# Patient Record
Sex: Female | Born: 1985
Health system: Southern US, Community
[De-identification: ages and names within clinical notes are randomized; demographics above are authoritative.]

## PROBLEM LIST (undated history)

## (undated) DIAGNOSIS — R87629 Unspecified abnormal cytological findings in specimens from vagina: Secondary | ICD-10-CM

## (undated) DIAGNOSIS — A64 Unspecified sexually transmitted disease: Secondary | ICD-10-CM

## (undated) DIAGNOSIS — B1920 Unspecified viral hepatitis C without hepatic coma: Secondary | ICD-10-CM

## (undated) DIAGNOSIS — B999 Unspecified infectious disease: Secondary | ICD-10-CM

## (undated) HISTORY — PX: OTHER SURGICAL HISTORY: SHX169

---
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
## 2009-02-11 ENCOUNTER — Emergency Department (HOSPITAL_BASED_OUTPATIENT_CLINIC_OR_DEPARTMENT_OTHER): Admission: EM | Admit: 2009-02-11 | Discharge: 2009-02-11 | Payer: Self-pay | Admitting: Emergency Medicine

## 2009-02-16 ENCOUNTER — Encounter: Admission: RE | Admit: 2009-02-16 | Discharge: 2009-02-16 | Payer: Self-pay | Admitting: Obstetrics and Gynecology

## 2009-04-20 ENCOUNTER — Ambulatory Visit: Payer: Self-pay | Admitting: Gastroenterology

## 2009-05-04 ENCOUNTER — Inpatient Hospital Stay (HOSPITAL_COMMUNITY): Admission: AD | Admit: 2009-05-04 | Discharge: 2009-05-07 | Payer: Self-pay | Admitting: Obstetrics and Gynecology

## 2009-05-08 ENCOUNTER — Encounter: Admission: RE | Admit: 2009-05-08 | Discharge: 2009-06-06 | Payer: Self-pay | Admitting: Obstetrics & Gynecology

## 2009-05-13 ENCOUNTER — Inpatient Hospital Stay (HOSPITAL_COMMUNITY): Admission: AD | Admit: 2009-05-13 | Discharge: 2009-05-13 | Payer: Self-pay | Admitting: Obstetrics and Gynecology

## 2009-05-17 ENCOUNTER — Inpatient Hospital Stay (HOSPITAL_COMMUNITY): Admission: AD | Admit: 2009-05-17 | Discharge: 2009-05-17 | Payer: Self-pay | Admitting: Obstetrics & Gynecology

## 2009-06-07 ENCOUNTER — Encounter: Admission: RE | Admit: 2009-06-07 | Discharge: 2009-07-07 | Payer: Self-pay | Admitting: Obstetrics & Gynecology

## 2009-07-01 ENCOUNTER — Emergency Department (HOSPITAL_COMMUNITY): Admission: EM | Admit: 2009-07-01 | Discharge: 2009-07-01 | Payer: Self-pay | Admitting: Emergency Medicine

## 2009-07-03 ENCOUNTER — Emergency Department (HOSPITAL_BASED_OUTPATIENT_CLINIC_OR_DEPARTMENT_OTHER): Admission: EM | Admit: 2009-07-03 | Discharge: 2009-07-03 | Payer: Self-pay | Admitting: Emergency Medicine

## 2009-07-08 ENCOUNTER — Encounter: Admission: RE | Admit: 2009-07-08 | Discharge: 2009-08-06 | Payer: Self-pay | Admitting: Obstetrics & Gynecology

## 2009-08-07 ENCOUNTER — Encounter: Admission: RE | Admit: 2009-08-07 | Discharge: 2009-08-24 | Payer: Self-pay | Admitting: Obstetrics & Gynecology

## 2009-09-07 ENCOUNTER — Encounter: Admission: RE | Admit: 2009-09-07 | Discharge: 2009-10-07 | Payer: Self-pay | Admitting: Obstetrics & Gynecology

## 2009-10-08 ENCOUNTER — Encounter: Admission: RE | Admit: 2009-10-08 | Discharge: 2009-11-07 | Payer: Self-pay | Admitting: Obstetrics & Gynecology

## 2009-12-06 ENCOUNTER — Encounter: Admission: RE | Admit: 2009-12-06 | Discharge: 2010-01-05 | Payer: Self-pay | Admitting: Obstetrics & Gynecology

## 2009-12-09 ENCOUNTER — Emergency Department (HOSPITAL_BASED_OUTPATIENT_CLINIC_OR_DEPARTMENT_OTHER): Admission: EM | Admit: 2009-12-09 | Discharge: 2009-12-09 | Payer: Self-pay | Admitting: Emergency Medicine

## 2010-01-06 ENCOUNTER — Encounter: Admission: RE | Admit: 2010-01-06 | Discharge: 2010-02-05 | Payer: Self-pay | Admitting: Obstetrics & Gynecology

## 2010-02-06 ENCOUNTER — Encounter: Admission: RE | Admit: 2010-02-06 | Discharge: 2010-02-22 | Payer: Self-pay | Admitting: Obstetrics & Gynecology

## 2010-03-09 ENCOUNTER — Encounter: Admission: RE | Admit: 2010-03-09 | Discharge: 2010-04-03 | Payer: Self-pay | Admitting: Obstetrics & Gynecology

## 2010-09-17 ENCOUNTER — Encounter: Payer: Self-pay | Admitting: Obstetrics and Gynecology

## 2010-11-30 LAB — CULTURE, ROUTINE-ABSCESS
Culture: NO GROWTH
Gram Stain: NONE SEEN

## 2010-11-30 LAB — DIFFERENTIAL
Basophils Absolute: 0 10*3/uL (ref 0.0–0.1)
Basophils Absolute: 0.1 10*3/uL (ref 0.0–0.1)
Basophils Relative: 0 % (ref 0–1)
Basophils Relative: 0 % (ref 0–1)
Eosinophils Absolute: 0.2 10*3/uL (ref 0.0–0.7)
Eosinophils Absolute: 0.1 10*3/uL (ref 0.0–0.7)
Eosinophils Relative: 2 % (ref 0–5)
Eosinophils Relative: 1 % (ref 0–5)
Lymphocytes Relative: 36 % (ref 12–46)
Lymphocytes Relative: 19 % (ref 12–46)
Lymphs Abs: 3.1 10*3/uL (ref 0.7–4.0)
Lymphs Abs: 3.8 10*3/uL (ref 0.7–4.0)
Monocytes Absolute: 0.5 10*3/uL (ref 0.1–1.0)
Monocytes Absolute: 1.1 10*3/uL — ABNORMAL HIGH (ref 0.1–1.0)
Monocytes Relative: 6 % (ref 3–12)
Monocytes Relative: 5 % (ref 3–12)
Neutro Abs: 4.9 10*3/uL (ref 1.7–7.7)
Neutro Abs: 14.8 10*3/uL — ABNORMAL HIGH (ref 1.7–7.7)
Neutrophils Relative %: 56 % (ref 43–77)
Neutrophils Relative %: 74 % (ref 43–77)

## 2010-11-30 LAB — CBC
HCT: 38.9 % (ref 36.0–46.0)
HCT: 34.6 % — ABNORMAL LOW (ref 36.0–46.0)
HCT: 37.2 % (ref 36.0–46.0)
Hemoglobin: 13.1 g/dL (ref 12.0–15.0)
Hemoglobin: 11.9 g/dL — ABNORMAL LOW (ref 12.0–15.0)
Hemoglobin: 12.5 g/dL (ref 12.0–15.0)
MCHC: 33.6 g/dL (ref 30.0–36.0)
MCHC: 33.6 g/dL (ref 30.0–36.0)
MCHC: 34.4 g/dL (ref 30.0–36.0)
MCV: 97.3 fL (ref 78.0–100.0)
MCV: 96.6 fL (ref 78.0–100.0)
MCV: 97 fL (ref 78.0–100.0)
Platelets: 290 10*3/uL (ref 150–400)
Platelets: 158 10*3/uL (ref 150–400)
Platelets: 167 10*3/uL (ref 150–400)
RBC: 4 MIL/uL (ref 3.87–5.11)
RBC: 3.57 MIL/uL — ABNORMAL LOW (ref 3.87–5.11)
RBC: 3.86 MIL/uL — ABNORMAL LOW (ref 3.87–5.11)
RDW: 13.7 % (ref 11.5–15.5)
RDW: 13.2 % (ref 11.5–15.5)
RDW: 13.8 % (ref 11.5–15.5)
WBC: 8.8 10*3/uL (ref 4.0–10.5)
WBC: 19.2 10*3/uL — ABNORMAL HIGH (ref 4.0–10.5)
WBC: 19.9 10*3/uL — ABNORMAL HIGH (ref 4.0–10.5)

## 2010-11-30 LAB — RH IMMUNE GLOB WKUP(>/=20WKS)(NOT WOMEN'S HOSP): Fetal Screen: NEGATIVE

## 2010-11-30 LAB — RPR: RPR Ser Ql: NONREACTIVE

## 2011-07-02 ENCOUNTER — Encounter (HOSPITAL_COMMUNITY): Payer: Self-pay

## 2011-07-02 ENCOUNTER — Inpatient Hospital Stay (HOSPITAL_COMMUNITY): Payer: PRIVATE HEALTH INSURANCE

## 2011-07-02 ENCOUNTER — Inpatient Hospital Stay (HOSPITAL_COMMUNITY)
Admission: AD | Admit: 2011-07-02 | Discharge: 2011-07-02 | Disposition: A | Discharge status: Home / Self Care | Payer: PRIVATE HEALTH INSURANCE | Source: Ambulatory Visit | Attending: Obstetrics & Gynecology | Admitting: Obstetrics & Gynecology

## 2011-07-02 DIAGNOSIS — O0930 Supervision of pregnancy with insufficient antenatal care, unspecified trimester: Secondary | ICD-10-CM | POA: Insufficient documentation

## 2011-07-02 DIAGNOSIS — Z3480 Encounter for supervision of other normal pregnancy, unspecified trimester: Secondary | ICD-10-CM

## 2011-07-02 DIAGNOSIS — O99891 Other specified diseases and conditions complicating pregnancy: Secondary | ICD-10-CM | POA: Insufficient documentation

## 2011-07-02 DIAGNOSIS — R109 Unspecified abdominal pain: Secondary | ICD-10-CM | POA: Insufficient documentation

## 2011-07-02 HISTORY — DX: Unspecified sexually transmitted disease: A64

## 2011-07-02 HISTORY — DX: Unspecified viral hepatitis C without hepatic coma: B19.20

## 2011-07-02 HISTORY — DX: Unspecified infectious disease: B99.9

## 2011-07-02 MED ORDER — PRENATAL RX 60-1 MG PO TABS: 1.0000 | ORAL_TABLET | Freq: Every day | ORAL | Status: DC
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
## 2011-07-02 NOTE — ED Notes (Signed)
Kara [name] CNM in with pt

## 2011-07-02 NOTE — Progress Notes (Signed)
Patient states she had a positive home pregnancy test at the end of August. Wants to know how far she is. States she is not having any bleeding, pain or discharge at this time.

## 2011-08-27 NOTE — L&D Delivery Note (Signed)
Delivery Note At 7:59 PM a viable female was delivered via Vaginal, Spontaneous Delivery (Presentation: Middle Occiput Anterior).  APGAR: 7, 9; weight 7 lb 6.7 oz (3365 g).   Placenta status: Intact, Spontaneous.  Cord: 3 vessels with the following complications: None.  Cord pH: NA  Anesthesia: Epidural  Episiotomy: None Lacerations: None Suture Repair: NA Est. Blood Loss (mL): 300  Mom to postpartum.  Baby to nursery-stable. Placenta to: BS Feeding: Breast Circ: NA Contraception: Sherlynn Stalls, Emilo Gras 12/26/2011, 9:03 PM

## 2011-09-12 ENCOUNTER — Encounter: Payer: Self-pay | Admitting: *Deleted

## 2011-09-12 ENCOUNTER — Ambulatory Visit: Payer: PRIVATE HEALTH INSURANCE | Admitting: *Deleted

## 2011-09-12 DIAGNOSIS — Z3480 Encounter for supervision of other normal pregnancy, unspecified trimester: Secondary | ICD-10-CM

## 2011-09-12 DIAGNOSIS — O0930 Supervision of pregnancy with insufficient antenatal care, unspecified trimester: Secondary | ICD-10-CM

## 2011-09-12 LAB — OB RESULTS CONSOLE RPR: RPR: NONREACTIVE

## 2011-09-12 NOTE — Progress Notes (Signed)
Pt here for late OB care @ 28 wks.  Prenatal labs and 28 we GTT done.  Pt is positive for Hep C.  Pos abnormal pap smear in the past had had cryo on cervix.  She is scheduled for anatomy U/S 09/17/11 @ [facility].  She is scheduled for her prenatal exam on the 25th with our midwife.

## 2011-09-13 LAB — HIV ANTIBODY (ROUTINE TESTING W REFLEX): HIV: NONREACTIVE

## 2011-09-13 LAB — OBSTETRIC PANEL
Antibody Screen: NEGATIVE
Basophils Absolute: 0 10*3/uL (ref 0.0–0.1)
Basophils Relative: 0 % (ref 0–1)
Eosinophils Absolute: 0.1 10*3/uL (ref 0.0–0.7)
HCT: 34.1 % — ABNORMAL LOW (ref 36.0–46.0)
Hemoglobin: 11.7 g/dL — ABNORMAL LOW (ref 12.0–15.0)
Hepatitis B Surface Ag: NEGATIVE
Lymphocytes Relative: 26 % (ref 12–46)
Lymphs Abs: 2.6 10*3/uL (ref 0.7–4.0)
MCH: 31.3 pg (ref 26.0–34.0)
MCV: 91.2 fL (ref 78.0–100.0)
Monocytes Absolute: 0.4 10*3/uL (ref 0.1–1.0)
Neutro Abs: 6.6 10*3/uL (ref 1.7–7.7)
Neutrophils Relative %: 68 % (ref 43–77)
Platelets: 190 10*3/uL (ref 150–400)
RBC: 3.74 MIL/uL — ABNORMAL LOW (ref 3.87–5.11)
RDW: 12.7 % (ref 11.5–15.5)
Rh Type: NEGATIVE
Rubella: 12.9 IU/mL — ABNORMAL HIGH
WBC: 9.7 10*3/uL (ref 4.0–10.5)

## 2011-09-14 LAB — CULTURE, URINE COMPREHENSIVE: Organism ID, Bacteria: NO GROWTH

## 2011-09-14 LAB — GLUCOSE TOLERANCE, 1 HOUR: Glucose, 1 Hour GTT: 95 mg/dL (ref 70–140)

## 2011-09-17 ENCOUNTER — Encounter: Payer: Self-pay | Admitting: Obstetrics & Gynecology

## 2011-09-17 ENCOUNTER — Ambulatory Visit (HOSPITAL_COMMUNITY)
Admission: RE | Admit: 2011-09-17 | Discharge: 2011-09-17 | Disposition: A | Discharge status: Home / Self Care | Payer: PRIVATE HEALTH INSURANCE | Source: Ambulatory Visit | Attending: Obstetrics & Gynecology | Admitting: Obstetrics & Gynecology

## 2011-09-17 DIAGNOSIS — Z363 Encounter for antenatal screening for malformations: Secondary | ICD-10-CM | POA: Insufficient documentation

## 2011-09-17 DIAGNOSIS — Z1389 Encounter for screening for other disorder: Secondary | ICD-10-CM | POA: Insufficient documentation

## 2011-09-17 DIAGNOSIS — O359XX0 Maternal care for (suspected) fetal abnormality and damage, unspecified, not applicable or unspecified: Secondary | ICD-10-CM | POA: Insufficient documentation

## 2011-09-17 DIAGNOSIS — O0930 Supervision of pregnancy with insufficient antenatal care, unspecified trimester: Secondary | ICD-10-CM

## 2011-09-17 DIAGNOSIS — O358XX0 Maternal care for other (suspected) fetal abnormality and damage, not applicable or unspecified: Secondary | ICD-10-CM | POA: Insufficient documentation

## 2011-09-20 ENCOUNTER — Encounter: Payer: PRIVATE HEALTH INSURANCE | Admitting: Family

## 2011-09-23 ENCOUNTER — Encounter: Payer: PRIVATE HEALTH INSURANCE | Admitting: Obstetrics and Gynecology

## 2011-09-27 ENCOUNTER — Ambulatory Visit (INDEPENDENT_AMBULATORY_CARE_PROVIDER_SITE_OTHER): Payer: PRIVATE HEALTH INSURANCE | Admitting: Advanced Practice Midwife

## 2011-09-27 ENCOUNTER — Encounter: Payer: Self-pay | Admitting: Advanced Practice Midwife

## 2011-09-27 ENCOUNTER — Other Ambulatory Visit: Payer: Self-pay | Admitting: *Deleted

## 2011-09-27 DIAGNOSIS — F1120 Opioid dependence, uncomplicated: Secondary | ICD-10-CM

## 2011-09-27 DIAGNOSIS — Z6791 Unspecified blood type, Rh negative: Secondary | ICD-10-CM

## 2011-09-27 DIAGNOSIS — O359XX0 Maternal care for (suspected) fetal abnormality and damage, unspecified, not applicable or unspecified: Secondary | ICD-10-CM

## 2011-09-27 DIAGNOSIS — O09219 Supervision of pregnancy with history of pre-term labor, unspecified trimester: Secondary | ICD-10-CM

## 2011-09-27 DIAGNOSIS — Z3480 Encounter for supervision of other normal pregnancy, unspecified trimester: Secondary | ICD-10-CM | POA: Insufficient documentation

## 2011-09-27 DIAGNOSIS — Z113 Encounter for screening for infections with a predominantly sexual mode of transmission: Secondary | ICD-10-CM

## 2011-09-27 DIAGNOSIS — O360990 Maternal care for other rhesus isoimmunization, unspecified trimester, not applicable or unspecified: Secondary | ICD-10-CM

## 2011-09-27 DIAGNOSIS — O99340 Other mental disorders complicating pregnancy, unspecified trimester: Secondary | ICD-10-CM | POA: Insufficient documentation

## 2011-09-27 DIAGNOSIS — F411 Generalized anxiety disorder: Secondary | ICD-10-CM

## 2011-09-27 DIAGNOSIS — O09899 Supervision of other high risk pregnancies, unspecified trimester: Secondary | ICD-10-CM | POA: Insufficient documentation

## 2011-09-27 DIAGNOSIS — Z8619 Personal history of other infectious and parasitic diseases: Secondary | ICD-10-CM

## 2011-09-27 DIAGNOSIS — F419 Anxiety disorder, unspecified: Secondary | ICD-10-CM | POA: Insufficient documentation

## 2011-09-27 DIAGNOSIS — O99320 Drug use complicating pregnancy, unspecified trimester: Secondary | ICD-10-CM

## 2011-09-27 DIAGNOSIS — O358XX0 Maternal care for other (suspected) fetal abnormality and damage, not applicable or unspecified: Secondary | ICD-10-CM

## 2011-09-27 DIAGNOSIS — O0930 Supervision of pregnancy with insufficient antenatal care, unspecified trimester: Secondary | ICD-10-CM | POA: Insufficient documentation

## 2011-09-27 DIAGNOSIS — Z1272 Encounter for screening for malignant neoplasm of vagina: Secondary | ICD-10-CM

## 2011-09-27 MED ORDER — CONCEPT OB 130-92.4-1 MG PO CAPS: 1.0000 | ORAL_CAPSULE | Freq: Every day | ORAL | Status: AC

## 2011-09-27 MED ORDER — SERTRALINE HCL 50 MG PO TABS: 50.0000 mg | ORAL_TABLET | Freq: Every day | ORAL | Status: DC

## 2011-09-27 NOTE — Patient Instructions (Addendum)
CHANGES OCCURING IN THE SECOND TRIMESTER OF PREGNANCY Your body goes through many changes during pregnancy. They vary from person to person. Talk to your caregiver about changes you notice that you are concerned about.  During the second trimester, you will likely have an increase in your appetite. It is normal to have cravings for certain foods. This varies from person to person and pregnancy to pregnancy.   Your lower abdomen will begin to bulge.   You may have to urinate more often because the uterus and baby are pressing on your bladder. It is also common to get more bladder infections during pregnancy (pain with urination). You can help this by drinking lots of fluids and emptying your bladder before and after intercourse.   You may begin to get stretch marks on your hips, abdomen, and breasts. These are normal changes in the body during pregnancy. There are no exercises or medications to take that prevent this change.   You may begin to develop swollen and bulging veins (varicose veins) in your legs. Wearing support hose, elevating your feet for 15 minutes, 3 to 4 times a day and limiting salt in your diet helps lessen the problem.   Heartburn may develop as the uterus grows and pushes up against the stomach. Antacids recommended by your caregiver helps with this problem. Also, eating smaller meals 4 to 5 times a day helps.   Constipation can be treated with a stool softener or adding bulk to your diet. Drinking lots of fluids, vegetables, fruits, and whole grains are helpful.   Exercising is also helpful. If you have been very active up until your pregnancy, most of these activities can be continued during your pregnancy. If you have been less active, it is helpful to start an exercise program such as walking.   Hemorrhoids (varicose veins in the rectum) may develop at the end of the second trimester. Warm sitz baths and hemorrhoid cream recommended by your caregiver helps hemorrhoid  problems.   Backaches may develop during this time of your pregnancy. Avoid heavy lifting, wear low heal shoes and practice good posture to help with backache problems.   Some pregnant women develop tingling and numbness of their hand and fingers because of swelling and tightening of ligaments in the wrist (carpel tunnel syndrome). This goes away after the baby is born.   As your breasts enlarge, you may have to get a bigger bra. Get a comfortable, cotton, support bra. Do not get a nursing bra until the last month of the pregnancy if you will be nursing the baby.   You may get a dark line from your belly button to the pubic area called the linea nigra.   You may develop rosy cheeks because of increase blood flow to the face.   You may develop spider looking lines of the face, neck, arms and chest. These go away after the baby is born.  HOME CARE INSTRUCTIONS   It is extremely important to avoid all smoking, herbs, alcohol, and unprescribed drugs during your pregnancy. These chemicals affect the formation and growth of the baby. Avoid these chemicals throughout the pregnancy to ensure the delivery of a healthy infant.   Most of your home care instructions are the same as suggested for the first trimester of your pregnancy. Keep your caregiver's appointments. Follow your caregiver's instructions regarding medication use, exercise and diet.   During pregnancy, you are providing food for you and your baby. Continue to eat regular, well-balanced meals. Choose  foods such as meat, fish, milk and other low fat dairy products, vegetables, fruits, and whole-grain breads and cereals. Your caregiver will tell you of the ideal weight gain.   A physical sexual relationship may be continued up until near the end of pregnancy if there are no other problems. Problems could include early (premature) leaking of amniotic fluid from the membranes, vaginal bleeding, abdominal pain, or other medical or pregnancy  problems.   Exercise regularly if there are no restrictions. Check with your caregiver if you are unsure of the safety of some of your exercises. The greatest weight gain will occur in the last 2 trimesters of pregnancy. Exercise will help you:   Control your weight.   Get you in shape for labor and delivery.   Lose weight after you have the baby.   Wear a good support or jogging bra for breast tenderness during pregnancy. This may help if worn during sleep. Pads or tissues may be used in the bra if you are leaking colostrum.   Do not use hot tubs, steam rooms or saunas throughout the pregnancy.   Wear your seat belt at all times when driving. This protects you and your baby if you are in an accident.   Avoid raw meat, uncooked cheese, cat litter boxes and soil used by cats. These carry germs that can cause birth defects in the baby.   The second trimester is also a good time to visit your dentist for your dental health if this has not been done yet. Getting your teeth cleaned is OK. Use a soft toothbrush. Brush gently during pregnancy.   It is easier to loose urine during pregnancy. Tightening up and strengthening the pelvic muscles will help with this problem. Practice stopping your urination while you are going to the bathroom. These are the same muscles you need to strengthen. It is also the muscles you would use as if you were trying to stop from passing gas. You can practice tightening these muscles up 10 times a set and repeating this about 3 times per day. Once you know what muscles to tighten up, do not perform these exercises during urination. It is more likely to contribute to an infection by backing up the urine.   Ask for help if you have financial, counseling or nutritional needs during pregnancy. Your caregiver will be able to offer counseling for these needs as well as refer you for other special needs.   Your skin may become oily. If so, wash your face with mild soap, use  non-greasy moisturizer and oil or cream based makeup.  MEDICATIONS AND DRUG USE IN PREGNANCY  Take prenatal vitamins as directed. The vitamin should contain 1 milligram of folic acid. Keep all vitamins out of reach of children. Only a couple vitamins or tablets containing iron may be fatal to a baby or young child when ingested.   Avoid use of all medications, including herbs, over-the-counter medications, not prescribed or suggested by your caregiver. Only take over-the-counter or prescription medicines for pain, discomfort, or fever as directed by your caregiver. Do not use aspirin.   Let your caregiver also know about herbs you may be using.   Alcohol is related to a number of birth defects. This includes fetal alcohol syndrome. All alcohol, in any form, should be avoided completely. Smoking will cause low birth rate and premature babies.   Street or illegal drugs are very harmful to the baby. They are absolutely forbidden. A baby born to an  addicted mother will be addicted at birth. The baby will go through the same withdrawal an adult does.  SEEK MEDICAL CARE IF:  You have any concerns or worries during your pregnancy. It is better to call with your questions if you feel they cannot wait, rather than worry about them. SEEK IMMEDIATE MEDICAL CARE IF:   An unexplained oral temperature above 102 F (38.9 C) develops, or as your caregiver suggests.   You have leaking of fluid from the vagina (birth canal). If leaking membranes are suspected, take your temperature and tell your caregiver of this when you call.   There is vaginal spotting, bleeding, or passing clots. Tell your caregiver of the amount and how many pads are used. Light spotting in pregnancy is common, especially following intercourse.   You develop a bad smelling vaginal discharge with a change in the color from clear to white.   You continue to feel sick to your stomach (nauseated) and have no relief from remedies suggested.  You vomit blood or coffee ground-like materials.   You lose more than 2 pounds of weight or gain more than 2 pounds of weight over 1 week, or as suggested by your caregiver.   You notice swelling of your face, hands, feet, or legs.   You get exposed to [place] measles and have never had them.   You are exposed to fifth disease or chickenpox.   You develop belly (abdominal) pain. Round ligament discomfort is a common non-cancerous (benign) cause of abdominal pain in pregnancy. Your caregiver still must evaluate you.   You develop a bad headache that does not go away.   You develop fever, diarrhea, pain with urination, or shortness of breath.   You develop visual problems, blurry, or double vision.   You fall or are in a car accident or any kind of trauma.   There is mental or physical violence at home.  Document Released: 08/06/2001 Document Revised: 04/24/2011 Document Reviewed: 02/08/2009 [facility] Patient Information 2012 [place], [place].  Anxiety and Panic Attacks Your caregiver has informed you that you are having an anxiety or panic attack. There may be many forms of this. Most of the time these attacks come suddenly and without warning. They come at any time of day, including periods of sleep, and at any time of life. They may be strong and unexplained. Although panic attacks are very scary, they are physically harmless. Sometimes the cause of your anxiety is not known. Anxiety is a protective mechanism of the body in its fight or flight mechanism. Most of these perceived danger situations are actually nonphysical situations (such as anxiety over losing a job). CAUSES  The causes of an anxiety or panic attack are many. Panic attacks may occur in otherwise healthy people given a certain set of circumstances. There may be a genetic cause for panic attacks. Some medications may also have anxiety as a side effect. SYMPTOMS  Some of the most common feelings are:  Intense terror.    Dizziness, feeling faint.   Hot and cold flashes.   Fear of going crazy.   Feelings that nothing is real.   Sweating.   Shaking.   Chest pain or a fast heartbeat (palpitations).   Smothering, choking sensations.   Feelings of impending doom and that death is near.   Tingling of extremities, this may be from over-breathing.   Altered reality (derealization).   Being detached from yourself (depersonalization).  Several symptoms can be present to make up anxiety or  panic attacks. DIAGNOSIS  The evaluation by your caregiver will depend on the type of symptoms you are experiencing. The diagnosis of anxiety or panic attack is made when no physical illness can be determined to be a cause of the symptoms. TREATMENT  Treatment to prevent anxiety and panic attacks may include:  Avoidance of circumstances that cause anxiety.   Reassurance and relaxation.   Regular exercise.   Relaxation therapies, such as yoga.   Psychotherapy with a psychiatrist or therapist.   Avoidance of caffeine, alcohol and illegal drugs.   Prescribed medication.  SEEK IMMEDIATE MEDICAL CARE IF:   You experience panic attack symptoms that are different than your usual symptoms.   You have any worsening or concerning symptoms.  Document Released: 08/12/2005 Document Revised: 04/24/2011 Document Reviewed: 12/14/2009 [facility] Patient Information 2012 [place], [place].

## 2011-09-27 NOTE — Progress Notes (Signed)
Subjective:    Kara [name] is a [id number] [redacted]w[redacted]d being seen today for her first obstetrical visit.  Her obstetrical history is significant for late/limited prenatal care, Saboxone for Hx opiod dependence, Hx Hep C, anxiety disorder, spontaneous PTD at 36 weeks (too late for 17-P) and LVEIF on anatomy scan. Patient does intend to breast feed. Pregnancy history fully reviewed.  Patient reports no bleeding, no contractions, no cramping and no leaking.  Filed Vitals:   09/27/11 1028  BP: 118/65  Temp: 98.4 F (36.9 C)  Weight: 199 lb (90.266 kg)    HISTORY: OB History    Grav Para Term Preterm Abortions TAB SAB Ect Mult Living   4 1  1 2 2    1      # Outc Date GA Lbr Len/2nd Wgt Sex Del Anes PTL Lv   1 PRE 9/10 [redacted]w[redacted]d   F SVD EPI No Yes   2 TAB            3 TAB            4 CUR              Past Medical History  Diagnosis Date  . Hepatitis C     Dx 2009  . Infection   . STD (female)   . Preterm labor    Past Surgical History  Procedure Date  . Extraction of wisdome teeth     age 69   Family History  Problem Relation Age of Onset  . Adopted: Yes     Exam    Uterine Size: 28 cm  Pelvic Exam:    Perineum: Normal Perineum   Vulva: normal   Vagina:  normal mucosa, normal discharge   pH: NA   Cervix: multiparous appearance and no lesions   Adnexa: no mass, fullness, tenderness   Bony Pelvis: proven   System: Breast:  normal appearance, no masses or tenderness   Skin: normal coloration and turgor, no rashes    Neurologic: oriented, normal, normal mood, gait normal; reflexes normal and symmetric   Extremities: normal strength, tone, and muscle mass   HEENT PERRLA and sclera clear, anicteric   Mouth/Teeth mucous membranes moist, pharynx normal without lesions and dental hygiene good   Neck supple   Cardiovascular: regular rate and rhythm   Respiratory:  appears well, vitals normal, no respiratory distress, acyanotic, normal RR, ear and throat exam is normal,  neck free of mass or lymphadenopathy, chest clear, no wheezing, crepitations, rhonchi, normal symmetric air entry   Abdomen: soft, non-tender; bowel sounds normal; no masses,  no organomegaly   Urinary: urethral meatus normal     Assessment:    Pregnancy: [id number] Patient Active Problem List  Diagnoses  . 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 Suboxone maintenance treatment complicating pregnancy, antepartum  . Echogenic focus of heart of fetus affecting antepartum care of mother  . Rh negative state in antepartum period  . Normal pregnancy, repeat  . Anxiety in pregnancy, antepartum  . Insufficient prenatal care  . History of preterm delivery, currently pregnant       Hx Hep C      Plan:     Initial labs reviewed. Antibody screen repeated. Has Prenatal vitamins. Problem list reviewed and updated. Genetic Screening discussed: Too late for genetic screening.  Ultrasound discussed; fetal survey: results reviewed.  Follow up in 2 weeks. 75% of 30 min visit spent on counseling and coordination of care.  Rophylac given  Will  consult w/ MD regarding Hep C testing ROI for Hep C Dx, Tx  Kara [name] 09/27/2011

## 2011-09-27 NOTE — Progress Notes (Signed)
p-84  Pt needs CF screen @ next visit.

## 2011-09-28 LAB — ANTIBODY SCREEN: Antibody Screen: NEGATIVE

## 2011-09-30 ENCOUNTER — Telehealth: Payer: Self-pay | Admitting: *Deleted

## 2011-09-30 MED ORDER — RHO D IMMUNE GLOBULIN 1500 UNIT/2ML IJ SOLN
300.0000 ug | Freq: Once | INTRAMUSCULAR | Status: AC
  Administered 2011-09-30: 300 ug via INTRAMUSCULAR

## 2011-09-30 NOTE — Telephone Encounter (Signed)
CF results were not in system.  Called the lab and the CF was not ran because they did not receive a whole purple top tube.  Will draw at next appt.

## 2011-10-01 ENCOUNTER — Encounter: Payer: Self-pay | Admitting: Advanced Practice Midwife

## 2011-10-01 DIAGNOSIS — B1920 Unspecified viral hepatitis C without hepatic coma: Secondary | ICD-10-CM | POA: Insufficient documentation

## 2011-10-11 ENCOUNTER — Encounter: Payer: PRIVATE HEALTH INSURANCE | Admitting: Advanced Practice Midwife

## 2011-10-18 ENCOUNTER — Ambulatory Visit (INDEPENDENT_AMBULATORY_CARE_PROVIDER_SITE_OTHER): Payer: PRIVATE HEALTH INSURANCE | Admitting: Family

## 2011-10-18 DIAGNOSIS — Z3480 Encounter for supervision of other normal pregnancy, unspecified trimester: Secondary | ICD-10-CM

## 2011-10-18 DIAGNOSIS — Z8619 Personal history of other infectious and parasitic diseases: Secondary | ICD-10-CM

## 2011-10-18 NOTE — Progress Notes (Signed)
p-80 

## 2011-10-18 NOTE — Progress Notes (Signed)
No questions or concerns; doing well; obtained Hep c RNA and liver enzymes per Dr. Macon Large recommendation

## 2011-10-19 LAB — COMPREHENSIVE METABOLIC PANEL
Albumin: 3.3 g/dL — ABNORMAL LOW (ref 3.5–5.2)
BUN: 7 mg/dL (ref 6–23)
CO2: 20 mEq/L (ref 19–32)
Calcium: 7.8 mg/dL — ABNORMAL LOW (ref 8.4–10.5)
Chloride: 109 mEq/L (ref 96–112)
Glucose, Bld: 87 mg/dL (ref 70–99)
Potassium: 4.1 mEq/L (ref 3.5–5.3)
Total Protein: 5.9 g/dL — ABNORMAL LOW (ref 6.0–8.3)

## 2011-10-27 ENCOUNTER — Other Ambulatory Visit: Payer: Self-pay | Admitting: Family

## 2011-10-27 ENCOUNTER — Encounter: Payer: Self-pay | Admitting: Family

## 2011-10-27 DIAGNOSIS — B1920 Unspecified viral hepatitis C without hepatic coma: Secondary | ICD-10-CM

## 2011-11-01 ENCOUNTER — Ambulatory Visit (HOSPITAL_COMMUNITY)
Admission: RE | Admit: 2011-11-01 | Discharge: 2011-11-01 | Disposition: A | Discharge status: Home / Self Care | Payer: PRIVATE HEALTH INSURANCE | Source: Ambulatory Visit | Attending: Advanced Practice Midwife | Admitting: Advanced Practice Midwife

## 2011-11-01 ENCOUNTER — Encounter: Payer: PRIVATE HEALTH INSURANCE | Admitting: Physician Assistant

## 2011-11-01 DIAGNOSIS — O359XX0 Maternal care for (suspected) fetal abnormality and damage, unspecified, not applicable or unspecified: Secondary | ICD-10-CM

## 2011-11-01 DIAGNOSIS — O0930 Supervision of pregnancy with insufficient antenatal care, unspecified trimester: Secondary | ICD-10-CM | POA: Insufficient documentation

## 2011-11-01 DIAGNOSIS — Z8751 Personal history of pre-term labor: Secondary | ICD-10-CM | POA: Insufficient documentation

## 2011-11-01 DIAGNOSIS — O358XX0 Maternal care for other (suspected) fetal abnormality and damage, not applicable or unspecified: Secondary | ICD-10-CM | POA: Insufficient documentation

## 2011-11-01 DIAGNOSIS — F1120 Opioid dependence, uncomplicated: Secondary | ICD-10-CM

## 2011-11-04 ENCOUNTER — Encounter: Payer: PRIVATE HEALTH INSURANCE | Admitting: Obstetrics and Gynecology

## 2011-11-04 ENCOUNTER — Telehealth: Payer: Self-pay | Admitting: *Deleted

## 2011-11-04 DIAGNOSIS — Z3480 Encounter for supervision of other normal pregnancy, unspecified trimester: Secondary | ICD-10-CM

## 2011-11-04 NOTE — Telephone Encounter (Signed)
Pt no showed for routine prenatal today.  LM on cell phone to call and reschedule appt.  Pt also needs referral to GI for Hep C

## 2011-12-06 ENCOUNTER — Ambulatory Visit (INDEPENDENT_AMBULATORY_CARE_PROVIDER_SITE_OTHER): Payer: PRIVATE HEALTH INSURANCE | Admitting: Family

## 2011-12-06 VITALS — BP 110/67 | Temp 98.5°F | Wt 215.0 lb

## 2011-12-06 DIAGNOSIS — R894 Abnormal immunological findings in specimens from other organs, systems and tissues: Secondary | ICD-10-CM

## 2011-12-06 DIAGNOSIS — Z3480 Encounter for supervision of other normal pregnancy, unspecified trimester: Secondary | ICD-10-CM

## 2011-12-06 DIAGNOSIS — R768 Other specified abnormal immunological findings in serum: Secondary | ICD-10-CM

## 2011-12-06 NOTE — Progress Notes (Signed)
Addended by: Granville Lewis on: 12/06/2011 10:29 AM   Modules accepted: Orders

## 2011-12-06 NOTE — Progress Notes (Signed)
Pt missed previous OB visits due to illness in family; need to schedule appt to GI and MFM for elevated Hep C; also will do an ob ultrasound to recheck anatomy/growth; GBS completed today with GC/CT.

## 2011-12-06 NOTE — Progress Notes (Signed)
p-96  Need 36 wk culture today

## 2011-12-07 LAB — GC/CHLAMYDIA PROBE AMP, GENITAL
Chlamydia, DNA Probe: NEGATIVE
GC Probe Amp, Genital: NEGATIVE

## 2011-12-09 LAB — CULTURE, BETA STREP (GROUP B ONLY)

## 2011-12-09 LAB — OB RESULTS CONSOLE GBS: GBS: NEGATIVE

## 2011-12-12 ENCOUNTER — Ambulatory Visit (HOSPITAL_COMMUNITY)
Admission: RE | Admit: 2011-12-12 | Discharge: 2011-12-12 | Disposition: A | Discharge status: Home / Self Care | Payer: PRIVATE HEALTH INSURANCE | Source: Ambulatory Visit | Attending: Family | Admitting: Family

## 2011-12-12 DIAGNOSIS — Z8751 Personal history of pre-term labor: Secondary | ICD-10-CM | POA: Insufficient documentation

## 2011-12-12 DIAGNOSIS — O0930 Supervision of pregnancy with insufficient antenatal care, unspecified trimester: Secondary | ICD-10-CM | POA: Insufficient documentation

## 2011-12-12 DIAGNOSIS — O358XX0 Maternal care for other (suspected) fetal abnormality and damage, not applicable or unspecified: Secondary | ICD-10-CM | POA: Insufficient documentation

## 2011-12-12 DIAGNOSIS — O355XX0 Maternal care for (suspected) damage to fetus by drugs, not applicable or unspecified: Secondary | ICD-10-CM | POA: Insufficient documentation

## 2011-12-12 DIAGNOSIS — R768 Other specified abnormal immunological findings in serum: Secondary | ICD-10-CM

## 2011-12-13 ENCOUNTER — Encounter: Payer: PRIVATE HEALTH INSURANCE | Admitting: Family

## 2011-12-19 ENCOUNTER — Encounter: Payer: Self-pay | Admitting: Family

## 2011-12-19 ENCOUNTER — Telehealth: Payer: Self-pay

## 2011-12-19 NOTE — Telephone Encounter (Signed)
Called [facility] to sch. appt for pt to be seen for the Hep C and the office states they do not see or treat patients with Hep C. Called [facility] in [place] and they state they will see patient after she deliver the baby. Sent over referral to [facility] and given pt the telephone and info for that office. She will call the GI office when she deliver the baby.

## 2011-12-25 ENCOUNTER — Ambulatory Visit (INDEPENDENT_AMBULATORY_CARE_PROVIDER_SITE_OTHER): Payer: PRIVATE HEALTH INSURANCE | Admitting: Obstetrics & Gynecology

## 2011-12-25 ENCOUNTER — Inpatient Hospital Stay (HOSPITAL_COMMUNITY)
Admission: AD | Admit: 2011-12-25 | Discharge: 2011-12-28 | DRG: 775 | Disposition: A | Discharge status: Home / Self Care | Payer: PRIVATE HEALTH INSURANCE | Source: Ambulatory Visit | Attending: Obstetrics and Gynecology | Admitting: Obstetrics and Gynecology

## 2011-12-25 VITALS — BP 116/73 | Temp 98.2°F | Wt 220.0 lb

## 2011-12-25 DIAGNOSIS — IMO0001 Reserved for inherently not codable concepts without codable children: Secondary | ICD-10-CM

## 2011-12-25 DIAGNOSIS — F1111 Opioid abuse, in remission: Secondary | ICD-10-CM

## 2011-12-25 DIAGNOSIS — O0930 Supervision of pregnancy with insufficient antenatal care, unspecified trimester: Secondary | ICD-10-CM

## 2011-12-25 DIAGNOSIS — B1920 Unspecified viral hepatitis C without hepatic coma: Secondary | ICD-10-CM | POA: Diagnosis present

## 2011-12-25 DIAGNOSIS — F1110 Opioid abuse, uncomplicated: Secondary | ICD-10-CM

## 2011-12-25 DIAGNOSIS — F419 Anxiety disorder, unspecified: Secondary | ICD-10-CM

## 2011-12-25 DIAGNOSIS — O26619 Liver and biliary tract disorders in pregnancy, unspecified trimester: Secondary | ICD-10-CM | POA: Diagnosis present

## 2011-12-25 DIAGNOSIS — O99344 Other mental disorders complicating childbirth: Principal | ICD-10-CM | POA: Diagnosis present

## 2011-12-25 DIAGNOSIS — O09899 Supervision of other high risk pregnancies, unspecified trimester: Secondary | ICD-10-CM

## 2011-12-25 NOTE — Progress Notes (Signed)
p-86  GI will not see pt until after she delivers.  Records were faxed over to [facility].  Pt is to call when she delivers so that her appt can be made.  Blood-trace and leuks-trace

## 2011-12-25 NOTE — MAU Note (Signed)
C/o ucs since MD visit today, stronger since 2030

## 2011-12-25 NOTE — Progress Notes (Signed)
CMP to eval liver enzymes, labor precautions reviewed.  Pt aware baby will go to NICU for withdrawal.  Growth nml.

## 2011-12-25 NOTE — Patient Instructions (Signed)
Breastfeeding BENEFITS OF BREASTFEEDING For the baby  The first milk (colostrum) helps the baby's digestive system function better.   There are antibodies from the mother in the milk that help the baby fight off infections.   The baby has a lower incidence of asthma, allergies, and SIDS (sudden infant death syndrome).   The nutrients in breast milk are better than formulas for the baby and helps the baby's brain grow better.   Babies who breastfeed have less gas, colic, and constipation.  For the mother  Breastfeeding helps develop a very special bond between mother and baby.   It is more convenient, always available at the correct temperature and cheaper than formula feeding.   It burns calories in the mother and helps with losing weight that was gained during pregnancy.   It makes the uterus contract back down to normal size faster and slows bleeding following delivery.   Breastfeeding mothers have a lower risk of developing breast cancer.  NURSE FREQUENTLY  A healthy, full-term baby may breastfeed as often as every hour or space his or her feedings to every 3 hours.   How often to nurse will vary from baby to baby. Watch your baby for signs of hunger, not the clock.   Nurse as often as the baby requests, or when you feel the need to reduce the fullness of your breasts.   Awaken the baby if it has been 3 to 4 hours since the last feeding.   Frequent feeding will help the mother make more milk and will prevent problems like sore nipples and engorgement of the breasts.  BABY'S POSITION AT THE BREAST  Whether lying down or sitting, be sure that the baby's tummy is facing your tummy.   Support the breast with 4 fingers underneath the breast and the thumb above. Make sure your fingers are well away from the nipple and baby's mouth.   Stroke the baby's lips and cheek closest to the breast gently with your finger or nipple.   When the baby's mouth is open wide enough, place  all of your nipple and as much of the dark area around the nipple as possible into your baby's mouth.   Pull the baby in close so the tip of the nose and the baby's cheeks touch the breast during the feeding.  FEEDINGS  The length of each feeding varies from baby to baby and from feeding to feeding.   The baby must suck about 2 to 3 minutes for your milk to get to him or her. This is called a "let down." For this reason, allow the baby to feed on each breast as long as he or she wants. Your baby will end the feeding when he or she has received the right balance of nutrients.   To break the suction, put your finger into the corner of the baby's mouth and slide it between his or her gums before removing your breast from his or her mouth. This will help prevent sore nipples.  REDUCING BREAST ENGORGEMENT  In the first week after your baby is born, you may experience signs of breast engorgement. When breasts are engorged, they feel heavy, warm, full, and may be tender to the touch. You can reduce engorgement if you:   Nurse frequently, every 2 to 3 hours. Mothers who breastfeed early and often have fewer problems with engorgement.   Place light ice packs on your breasts between feedings. This reduces swelling. Wrap the ice packs in a   lightweight towel to protect your skin.   Apply moist hot packs to your breast for 5 to 10 minutes before each feeding. This increases circulation and helps the milk flow.   Gently massage your breast before and during the feeding.   Make sure that the baby empties at least one breast at every feeding before switching sides.   Use a breast pump to empty the breasts if your baby is sleepy or not nursing well. You may also want to pump if you are returning to work or or you feel you are getting engorged.   Avoid bottle feeds, pacifiers or supplemental feedings of water or juice in place of breastfeeding.   Be sure the baby is latched on and positioned properly while  breastfeeding.   Prevent fatigue, stress, and anemia.   Wear a supportive bra, avoiding underwire styles.   Eat a balanced diet with enough fluids.  If you follow these suggestions, your engorgement should improve in 24 to 48 hours. If you are still experiencing difficulty, call your lactation consultant or caregiver. IS MY BABY GETTING ENOUGH MILK? Sometimes, mothers worry about whether their babies are getting enough milk. You can be assured that your baby is getting enough milk if:  The baby is actively sucking and you hear swallowing.   The baby nurses at least 8 to 12 times in a 24 hour time period. Nurse your baby until he or she unlatches or falls asleep at the first breast (at least 10 to 20 minutes), then offer the second side.   The baby is wetting 5 to 6 disposable diapers (6 to 8 cloth diapers) in a 24 hour period by 5 to 6 days of age.   The baby is having at least 2 to 3 stools every 24 hours for the first few months. Breast milk is all the food your baby needs. It is not necessary for your baby to have water or formula. In fact, to help your breasts make more milk, it is best not to give your baby supplemental feedings during the early weeks.   The stool should be soft and yellow.   The baby should gain 4 to 7 ounces per week after he is 4 days old.  TAKE CARE OF YOURSELF Take care of your breasts by:  Bathing or showering daily.   Avoiding the use of soaps on your nipples.   Start feedings on your left breast at one feeding and on your right breast at the next feeding.   You will notice an increase in your milk supply 2 to 5 days after delivery. You may feel some discomfort from engorgement, which makes your breasts very firm and often tender. Engorgement "peaks" out within 24 to 48 hours. In the meantime, apply warm moist towels to your breasts for 5 to 10 minutes before feeding. Gentle massage and expression of some milk before feeding will soften your breasts, making  it easier for your baby to latch on. Wear a well fitting nursing bra and air dry your nipples for 10 to 15 minutes after each feeding.   Only use cotton bra pads.   Only use pure lanolin on your nipples after nursing. You do not need to wash it off before nursing.  Take care of yourself by:   Eating well-balanced meals and nutritious snacks.   Drinking milk, fruit juice, and water to satisfy your thirst (about 8 glasses a day).   Getting plenty of rest.   Increasing calcium in   your diet (1200 mg a day).   Avoiding foods that you notice affect the baby in a bad way.  SEEK MEDICAL CARE IF:   You have any questions or difficulty with breastfeeding.   You need help.   You have a hard, red, sore area on your breast, accompanied by a fever of 100.5 F (38.1 C) or more.   Your baby is too sleepy to eat well or is having trouble sleeping.   Your baby is wetting less than 6 diapers per day, by 5 days of age.   Your baby's skin or white part of his or her eyes is more yellow than it was in the hospital.   You feel depressed.  Document Released: 08/12/2005 Document Revised: 08/01/2011 Document Reviewed: 03/27/2009 [employer] Patient Information 2012 [employer]. Normal Labor and Delivery Your caregiver must first be sure you are in labor. Signs of labor include:  You may pass what is called "the mucus plug" before labor begins. This is a small amount of blood stained mucus.   Regular uterine contractions.   The time between contractions get closer together.   The discomfort and pain gradually gets more intense.   Pains are mostly located in the back.   Pains get worse when walking.   The cervix (the opening of the uterus becomes thinner (begins to efface) and opens up (dilates).  Once you are in labor and admitted into the hospital or care center, your caregiver will do the following:  A complete physical examination.   Check your vital signs (blood pressure, pulse,  temperature and the fetal heart rate).   Do a vaginal examination (using a sterile glove and lubricant) to determine:   The position (presentation) of the baby (head [vertex] or buttock first).   The level (station) of the baby's head in the birth canal.   The effacement and dilatation of the cervix.   You may have your pubic hair shaved and be given an enema depending on your caregiver and the circumstance.   An electronic monitor is usually placed on your abdomen. The monitor follows the length and intensity of the contractions, as well as the baby's heart rate.   Usually, your caregiver will insert an IV in your arm with a bottle of sugar water. This is done as a precaution so that medications can be given to you quickly during labor or delivery.  NORMAL LABOR AND DELIVERY IS DIVIDED UP INTO 3 STAGES: First Stage This is when regular contractions begin and the cervix begins to efface and dilate. This stage can last from 3 to 15 hours. The end of the first stage is when the cervix is 100% effaced and 10 centimeters dilated. Pain medications may be given by   Injection (morphine, demerol, etc.)   Regional anesthesia (spinal, caudal or epidural, anesthetics given in different locations of the spine). Paracervical pain medication may be given, which is an injection of and anesthetic on each side of the cervix.  A pregnant woman may request to have "Natural Childbirth" which is not to have any medications or anesthesia during her labor and delivery. Second Stage This is when the baby comes down through the birth canal (vagina) and is born. This can take 1 to 4 hours. As the baby's head comes down through the birth canal, you may feel like you are going to have a bowel movement. You will get the urge to bear down and push until the baby is delivered. As the baby's   head is being delivered, the caregiver will decide if an episiotomy (a cut in the perineum and vagina area) is needed to prevent  tearing of the tissue in this area. The episiotomy is sewn up after the delivery of the baby and placenta. Sometimes a mask with nitrous oxide is given for the mother to breath during the delivery of the baby to help if there is too much pain. The end of Stage 2 is when the baby is fully delivered. Then when the umbilical cord stops pulsating it is clamped and cut. Third Stage The third stage begins after the baby is completely delivered and ends after the placenta (afterbirth) is delivered. This usually takes 5 to 30 minutes. After the placenta is delivered, a medication is given either by intravenous or injection to help contract the uterus and prevent bleeding. The third stage is not painful and pain medication is usually not necessary. If an episiotomy was done, it is repaired at this time. After the delivery, the mother is watched and monitored closely for 1 to 2 hours to make sure there is no postpartum bleeding (hemorrhage). If there is a lot of bleeding, medication is given to contract the uterus and stop the bleeding. Document Released: 05/21/2008 Document Revised: 08/01/2011 Document Reviewed: 05/21/2008 [employer] Patient Information 2012 [employer].  

## 2011-12-26 ENCOUNTER — Encounter (HOSPITAL_COMMUNITY): Payer: Self-pay | Admitting: *Deleted

## 2011-12-26 ENCOUNTER — Encounter (HOSPITAL_COMMUNITY): Payer: Self-pay | Admitting: Anesthesiology

## 2011-12-26 ENCOUNTER — Inpatient Hospital Stay (HOSPITAL_COMMUNITY): Payer: PRIVATE HEALTH INSURANCE | Admitting: Anesthesiology

## 2011-12-26 DIAGNOSIS — O99344 Other mental disorders complicating childbirth: Secondary | ICD-10-CM

## 2011-12-26 DIAGNOSIS — B1920 Unspecified viral hepatitis C without hepatic coma: Secondary | ICD-10-CM

## 2011-12-26 DIAGNOSIS — O26619 Liver and biliary tract disorders in pregnancy, unspecified trimester: Secondary | ICD-10-CM

## 2011-12-26 DIAGNOSIS — F1110 Opioid abuse, uncomplicated: Secondary | ICD-10-CM

## 2011-12-26 LAB — COMPREHENSIVE METABOLIC PANEL
ALT: 26 U/L (ref 0–35)
AST: 33 U/L (ref 0–37)
Albumin: 3.7 g/dL (ref 3.5–5.2)
Alkaline Phosphatase: 173 U/L — ABNORMAL HIGH (ref 39–117)
BUN: 10 mg/dL (ref 6–23)
Chloride: 102 mEq/L (ref 96–112)
Creat: 0.57 mg/dL (ref 0.50–1.10)
Potassium: 4.4 mEq/L (ref 3.5–5.3)

## 2011-12-26 LAB — CBC
Platelets: 135 10*3/uL — ABNORMAL LOW (ref 150–400)
RBC: 4.39 MIL/uL (ref 3.87–5.11)
RDW: 12.6 % (ref 11.5–15.5)
WBC: 14.5 10*3/uL — ABNORMAL HIGH (ref 4.0–10.5)

## 2011-12-26 LAB — RAPID URINE DRUG SCREEN, HOSP PERFORMED
Amphetamines: NOT DETECTED
Barbiturates: NOT DETECTED
Benzodiazepines: NOT DETECTED

## 2011-12-26 MED ORDER — LACTATED RINGERS IV SOLN: 500.0000 mL | INTRAVENOUS | Status: DC | PRN

## 2011-12-26 MED ORDER — TETANUS-DIPHTH-ACELL PERTUSSIS 5-2.5-18.5 LF-MCG/0.5 IM SUSP: 0.5000 mL | Freq: Once | INTRAMUSCULAR | Status: DC

## 2011-12-26 MED ORDER — ONDANSETRON HCL 4 MG PO TABS: 4.0000 mg | ORAL_TABLET | ORAL | Status: DC | PRN

## 2011-12-26 MED ORDER — DIBUCAINE 1 % RE OINT: 1.0000 "application " | TOPICAL_OINTMENT | RECTAL | Status: DC | PRN

## 2011-12-26 MED ORDER — WITCH HAZEL-GLYCERIN EX PADS: 1.0000 "application " | MEDICATED_PAD | CUTANEOUS | Status: DC | PRN

## 2011-12-26 MED ORDER — ONDANSETRON HCL 4 MG/2ML IJ SOLN
4.0000 mg | Freq: Four times a day (QID) | INTRAMUSCULAR | Status: DC | PRN
  Filled 2011-12-26: qty 2

## 2011-12-26 MED ORDER — LIDOCAINE HCL (PF) 1 % IJ SOLN
30.0000 mL | INTRAMUSCULAR | Status: DC | PRN
  Filled 2011-12-26 (×2): qty 30

## 2011-12-26 MED ORDER — SERTRALINE HCL 50 MG PO TABS
50.0000 mg | ORAL_TABLET | Freq: Every day | ORAL | Status: DC
  Filled 2011-12-26 (×2): qty 1

## 2011-12-26 MED ORDER — ONDANSETRON HCL 4 MG/2ML IJ SOLN: 4.0000 mg | INTRAMUSCULAR | Status: DC | PRN

## 2011-12-26 MED ORDER — FENTANYL 2.5 MCG/ML BUPIVACAINE 1/10 % EPIDURAL INFUSION (WH - ANES)
INTRAMUSCULAR | Status: DC | PRN
  Administered 2011-12-26: 14 mL/h via EPIDURAL

## 2011-12-26 MED ORDER — FLEET ENEMA 7-19 GM/118ML RE ENEM: 1.0000 | ENEMA | RECTAL | Status: DC | PRN

## 2011-12-26 MED ORDER — PRENATAL MULTIVITAMIN CH
1.0000 | ORAL_TABLET | Freq: Every day | ORAL | Status: DC
  Administered 2011-12-28: 1 via ORAL
  Filled 2011-12-26: qty 1

## 2011-12-26 MED ORDER — IBUPROFEN 600 MG PO TABS: 600.0000 mg | ORAL_TABLET | Freq: Four times a day (QID) | ORAL | Status: DC | PRN

## 2011-12-26 MED ORDER — EPHEDRINE 5 MG/ML INJ: 10.0000 mg | INTRAVENOUS | Status: DC | PRN

## 2011-12-26 MED ORDER — OXYTOCIN 20 UNITS IN LACTATED RINGERS INFUSION - SIMPLE
1.0000 m[IU]/min | INTRAVENOUS | Status: DC
  Administered 2011-12-26: 2 m[IU]/min via INTRAVENOUS

## 2011-12-26 MED ORDER — OXYCODONE-ACETAMINOPHEN 5-325 MG PO TABS: 1.0000 | ORAL_TABLET | ORAL | Status: DC | PRN

## 2011-12-26 MED ORDER — MAGNESIUM HYDROXIDE 400 MG/5ML PO SUSP: 30.0000 mL | ORAL | Status: DC | PRN

## 2011-12-26 MED ORDER — DIPHENHYDRAMINE HCL 50 MG/ML IJ SOLN: 12.5000 mg | INTRAMUSCULAR | Status: DC | PRN

## 2011-12-26 MED ORDER — EPHEDRINE 5 MG/ML INJ
10.0000 mg | INTRAVENOUS | Status: DC | PRN
Start: 2011-12-26 — End: 2011-12-26
  Administered 2011-12-26: 10 mg via INTRAVENOUS
  Filled 2011-12-26: qty 4

## 2011-12-26 MED ORDER — TERBUTALINE SULFATE 1 MG/ML IJ SOLN: 0.2500 mg | Freq: Once | INTRAMUSCULAR | Status: DC | PRN

## 2011-12-26 MED ORDER — HOME MED STORE IN PYXIS: 1.0000 | Status: DC

## 2011-12-26 MED ORDER — LACTATED RINGERS IV SOLN
INTRAVENOUS | Status: DC
  Administered 2011-12-26 (×3): via INTRAVENOUS

## 2011-12-26 MED ORDER — PHENYLEPHRINE 40 MCG/ML (10ML) SYRINGE FOR IV PUSH (FOR BLOOD PRESSURE SUPPORT)
80.0000 ug | PREFILLED_SYRINGE | INTRAVENOUS | Status: DC | PRN
  Filled 2011-12-26: qty 5

## 2011-12-26 MED ORDER — OXYTOCIN BOLUS FROM INFUSION
500.0000 mL | Freq: Once | INTRAVENOUS | Status: AC
  Administered 2011-12-26: 500 mL via INTRAVENOUS
  Filled 2011-12-26: qty 500
  Filled 2011-12-26: qty 1000

## 2011-12-26 MED ORDER — NALBUPHINE SYRINGE 5 MG/0.5 ML: 5.0000 mg | INJECTION | INTRAMUSCULAR | Status: DC | PRN

## 2011-12-26 MED ORDER — BUPRENORPHINE HCL-NALOXONE HCL 8-2 MG SL FILM
1.0000 | ORAL_FILM | SUBLINGUAL | Status: DC
  Administered 2011-12-26 – 2011-12-28 (×11): 1 via SUBLINGUAL

## 2011-12-26 MED ORDER — DIPHENHYDRAMINE HCL 25 MG PO CAPS: 25.0000 mg | ORAL_CAPSULE | Freq: Four times a day (QID) | ORAL | Status: DC | PRN

## 2011-12-26 MED ORDER — IBUPROFEN 600 MG PO TABS
600.0000 mg | ORAL_TABLET | Freq: Four times a day (QID) | ORAL | Status: DC
  Administered 2011-12-27 – 2011-12-28 (×5): 600 mg via ORAL
  Filled 2011-12-26 (×5): qty 1

## 2011-12-26 MED ORDER — BENZOCAINE-MENTHOL 20-0.5 % EX AERO: 1.0000 "application " | INHALATION_SPRAY | CUTANEOUS | Status: DC | PRN

## 2011-12-26 MED ORDER — LACTATED RINGERS IV SOLN
500.0000 mL | Freq: Once | INTRAVENOUS | Status: DC
Start: 2011-12-26 — End: 2011-12-26

## 2011-12-26 MED ORDER — SENNOSIDES-DOCUSATE SODIUM 8.6-50 MG PO TABS
2.0000 | ORAL_TABLET | Freq: Every day | ORAL | Status: DC
  Administered 2011-12-27: 2 via ORAL

## 2011-12-26 MED ORDER — PHENYLEPHRINE 40 MCG/ML (10ML) SYRINGE FOR IV PUSH (FOR BLOOD PRESSURE SUPPORT): 80.0000 ug | PREFILLED_SYRINGE | INTRAVENOUS | Status: DC | PRN

## 2011-12-26 MED ORDER — CITRIC ACID-SODIUM CITRATE 334-500 MG/5ML PO SOLN: 30.0000 mL | ORAL | Status: DC | PRN

## 2011-12-26 MED ORDER — OXYTOCIN 20 UNITS IN LACTATED RINGERS INFUSION - SIMPLE: 125.0000 mL/h | Freq: Once | INTRAVENOUS | Status: DC

## 2011-12-26 MED ORDER — ZOLPIDEM TARTRATE 5 MG PO TABS: 5.0000 mg | ORAL_TABLET | Freq: Every evening | ORAL | Status: DC | PRN

## 2011-12-26 MED ORDER — FENTANYL 2.5 MCG/ML BUPIVACAINE 1/10 % EPIDURAL INFUSION (WH - ANES)
14.0000 mL/h | INTRAMUSCULAR | Status: DC
  Administered 2011-12-26 (×2): 14 mL/h via EPIDURAL
  Filled 2011-12-26 (×3): qty 60

## 2011-12-26 MED ORDER — SIMETHICONE 80 MG PO CHEW: 80.0000 mg | CHEWABLE_TABLET | ORAL | Status: DC | PRN

## 2011-12-26 MED ORDER — MEASLES, MUMPS & RUBELLA VAC ~~LOC~~ INJ
0.5000 mL | INJECTION | Freq: Once | SUBCUTANEOUS | Status: DC
  Filled 2011-12-26: qty 0.5

## 2011-12-26 MED ORDER — ACETAMINOPHEN 325 MG PO TABS
650.0000 mg | ORAL_TABLET | ORAL | Status: DC | PRN
  Administered 2011-12-26: 650 mg via ORAL
  Filled 2011-12-26: qty 2

## 2011-12-26 MED ORDER — FERROUS SULFATE 325 (65 FE) MG PO TABS
325.0000 mg | ORAL_TABLET | Freq: Two times a day (BID) | ORAL | Status: DC
  Administered 2011-12-27 – 2011-12-28 (×3): 325 mg via ORAL
  Filled 2011-12-26 (×3): qty 1

## 2011-12-26 MED ORDER — LANOLIN HYDROUS EX OINT: 1.0000 "application " | TOPICAL_OINTMENT | CUTANEOUS | Status: DC | PRN

## 2011-12-26 MED ORDER — SODIUM BICARBONATE 8.4 % IV SOLN
INTRAVENOUS | Status: DC | PRN
  Administered 2011-12-26: 4 mL via EPIDURAL

## 2011-12-26 NOTE — Progress Notes (Signed)
Kara [name] is a 26 y.o. [id number] at [redacted]w[redacted]d.  Subjective: Uncomfortable w/ UC's, States they feel stronger. Requesting something for pain.  Objective: BP 106/67  Pulse 82  Temp(Src) 97.6 F (36.4 C) (Oral)  Resp 16  Ht 5\' 4"  (1.626 m)  Wt 99.791 kg (220 lb)  BMI 37.76 kg/m2  SpO2 98%  LMP 02/25/2011      FHT:  FHR: 110 bpm, variability: moderate,  accelerations:  Present,  decelerations:  Absent UC:   irregular, every 2-5 minutes, mild-moderate SVE:   Dilation: 4 Effacement (%): 80 Station: -2 Exam by:: Kara [name] CNM  Labs:  Assessment / Plan: Early labor, no further progress  Labor: No progress. Preeclampsia:  NA Fetal Wellbeing:  Category I Pain Control:  Labor support without medications I/D:  n/a Anticipated MOD:  NSVD Offered D/C home vs augmentation and R/B/I of each. Pt desires augmentation. AROM moderate amount of clear fluid. Pitocin if no change in 2 hours. May have epidural due to being of Suboxone.   Kara [name] 12/26/2011, 10:30 AM

## 2011-12-26 NOTE — Progress Notes (Signed)
Kara [name] is a 26 y.o. [id number] at [redacted]w[redacted]d.  Subjective: Comfortable w/ epidural  Objective: BP 115/76  Pulse 90  Temp(Src) 97.4 F (36.3 C) (Oral)  Resp 16  Ht 5\' 4"  (1.626 m)  Wt 99.791 kg (220 lb)  BMI 37.76 kg/m2  SpO2 98%  LMP 02/25/2011   Total I/O In: -  Out: 250 [Urine:250]  FHT:  FHR: 120 bpm, variability: moderate,  accelerations:  Present,  decelerations:  Present variables UC:   irregular, every 2-6 minutes, moderate SVE:   Dilation: 5 Effacement (%): 90 Station: -1 Exam by:: Kara [name] CNM  Labs:  Assessment / Plan: Augmentation of labor, progressing well  Labor: Progressing on Pitocin, continue to increase Preeclampsia:  NA Fetal Wellbeing:  Category II Pain Control:  Epidural I/D:  n/a Anticipated MOD:  NSVD  Kara [name] 12/26/2011, 3:30 PM

## 2011-12-26 NOTE — Anesthesia Preprocedure Evaluation (Signed)
Anesthesia Evaluation    Airway       Dental   Pulmonary          Cardiovascular     Neuro/Psych PSYCHIATRIC DISORDERS    GI/Hepatic (+) Hepatitis -, C  Endo/Other  Morbid obesity  Renal/GU      Musculoskeletal   Abdominal   Peds  Hematology   Anesthesia Other Findings Hx of Opiate abuse; currently on Suboxone  Reproductive/Obstetrics                           Anesthesia Physical Anesthesia Plan  ASA: III  Anesthesia Plan: Epidural   Post-op Pain Management:    Induction:   Airway Management Planned:   Additional Equipment:   Intra-op Plan:   Post-operative Plan:   Informed Consent: I have reviewed the patients History and Physical, chart, labs and discussed the procedure including the risks, benefits and alternatives for the proposed anesthesia with the patient or authorized representative who has indicated his/her understanding and acceptance.   Dental Advisory Given  Plan Discussed with:   Anesthesia Plan Comments: (Labs checked- platelets confirmed with RN in room. Fetal heart tracing, per RN, reported to be stable enough for sitting procedure. Discussed epidural, and patient consents to the procedure:  included risk of possible headache,backache, failed block, allergic reaction, and nerve injury. This patient was asked if she had any questions or concerns before the procedure started. )        Anesthesia Quick Evaluation

## 2011-12-26 NOTE — Progress Notes (Signed)
Kara [name] is a 26 y.o. [id number] at [redacted]w[redacted]d.  Subjective: Mild pressure  Objective: BP 125/71  Pulse 88  Temp(Src) 98 F (36.7 C) (Oral)  Resp 16  Ht 5\' 4"  (1.626 m)  Wt 99.791 kg (220 lb)  BMI 37.76 kg/m2  SpO2 98%  LMP 02/25/2011   Total I/O In: -  Out: 950 [Urine:950]  FHT:  FHR: 125 bpm, variability: moderate,  accelerations:  Present,  decelerations:  Present variables UC:   irregular, every 1.5-5 minutes, moderate SVE:   Dilation: 6 Effacement (%): 100 Station: -1 Exam by:: E. Cone RNC  Labs: Lab Results  Component Value Date   WBC 14.5* 12/26/2011   HGB 13.5 12/26/2011   HCT 39.2 12/26/2011   MCV 89.3 12/26/2011   PLT 135* 12/26/2011    Assessment / Plan: Augmentation of labor, progressing slowly  Labor: early. Continue increasing pitocin Preeclampsia:  NA Fetal Wellbeing:  Category II Pain Control:  Epidural I/D:  n/a Anticipated MOD:  NSVD  Vinh Sachs 12/26/2011, 5:20 PM

## 2011-12-26 NOTE — H&P (Signed)
Kara [name] is a 26 y.o. female presenting for labor evaluation.  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  She is Hepatitis C positive.   Maternal Medical History:  Reason for admission: Reason for admission: contractions.  Reason for Admission:   nauseaContractions: Onset was 3-5 hours ago.   Frequency: regular.   Duration is approximately 5 minutes.   Perceived severity is strong.    Fetal activity: Perceived fetal activity is normal.   Last perceived fetal movement was within the past hour.    Prenatal complications: Substance abuse.   Prenatal Complications - Diabetes: none.    OB History    Grav Para Term Preterm Abortions TAB SAB Ect Mult Living   4 1  1 2 2    1      Past Medical History  Diagnosis Date  . Hepatitis C     Dx 2009  . Infection   . STD (female)   . Preterm labor    Past Surgical History  Procedure Date  . Extraction of wisdome teeth     age 45   Family History: family history is not on file.  She is adopted. Social History:  reports that she has been smoking Cigarettes.  She has a 2 pack-year smoking history. She has never used smokeless tobacco. She reports that she drinks alcohol. She reports that she uses illicit drugs (Marijuana and Other-see comments).  Review of Systems  Constitutional: Negative for fever, chills and malaise/fatigue.  Eyes: Negative for blurred vision.  Respiratory: Negative for cough and shortness of breath.   Cardiovascular: Negative for chest pain.  Gastrointestinal: Positive for abdominal pain. Negative for heartburn, nausea and vomiting.  Genitourinary: Negative for dysuria, urgency and frequency.  Musculoskeletal: Negative.   Neurological: Negative for dizziness and headaches.  Psychiatric/Behavioral: Negative for depression.    Dilation: 4 Effacement (%): 70 Station: -1 Exam by:: B Erdy RN Blood pressure 111/66, pulse 72, temperature 97 F (36.1 C), temperature source Oral,  resp. rate 16, height 5\' 4"  (1.626 m), weight 99.791 kg (220 lb), last menstrual period 02/25/2011, SpO2 99.00%. Maternal Exam:  Uterine Assessment: Contraction strength is moderate.  Contraction duration is 70 seconds. Contraction frequency is regular.   Abdomen: Patient reports no abdominal tenderness. Cervix: Cervix evaluated by digital exam.     Fetal Exam Fetal Monitor Review: Mode: ultrasound.   Baseline rate: 135.  Variability: moderate (6-25 bpm).   Pattern: accelerations present and no decelerations.    Fetal State Assessment: Category I - tracings are normal.    Anatomy scan LVEIF, otherwise normal. Adjusted DSR 1:561.Too late for genetic screening.  1 hr 95;  Physical Exam  Nursing note and vitals reviewed. Constitutional: She is oriented to person, place, and time. She appears well-developed and well-nourished.  Neck: Normal range of motion.  Cardiovascular: Normal rate, regular rhythm and normal heart sounds.   Respiratory: Effort normal.  GI: Soft.  Genitourinary:       Cervix 4/80/-2, soft anterior per RN  Musculoskeletal: Normal range of motion.  Neurological: She is alert and oriented to person, place, and time. She has normal reflexes.  Skin: Skin is warm and dry.  Psychiatric: She has a normal mood and affect. Her behavior is normal. Judgment and thought content normal.    Prenatal labs: ABO, Rh: B/NEG/-- (01/17 1603) Antibody: NEG (02/01 1201) Rubella: 12.9 (01/17 1603) RPR: NON REAC (01/17 1603)  HBsAg: NEGATIVE (01/17 1603)  HIV: NON REACTIVE (01/17 1603)  GBS:   Negative  Assessment/Plan: Active labor  Admit to birthing suites Expectant management Anticipate NSVD   LEFTWICH-KIRBY, Laruen Risser 12/26/2011, 2:19 AM

## 2011-12-26 NOTE — Progress Notes (Signed)
Kara [name] is a 26 y.o. [id number] at [redacted]w[redacted]d.  Subjective: Comfortable w/ epidural  Objective: BP 106/67  Pulse 82  Temp(Src) 97.6 F (36.4 C) (Oral)  Resp 16  Ht 5\' 4"  (1.626 m)  Wt 99.791 kg (220 lb)  BMI 37.76 kg/m2  SpO2 98%  LMP 02/25/2011      FHT:  FHR: 110 bpm, variability: moderate,  accelerations:  Present,  decelerations:  Absent UC:   irregular, every 1-5 minutes, moderate SVE:   Dilation: 4 Effacement (%): 80 Station: -2 Exam by:: Ivonne Andrew CNM  Labs: Lab Results  Component Value Date   WBC 14.5* 12/26/2011   HGB 13.5 12/26/2011   HCT 39.2 12/26/2011   MCV 89.3 12/26/2011   PLT 135* 12/26/2011    Assessment / Plan: Augmentation of labor, no progress w/ AROM  Labor: Early Preeclampsia:  NA Fetal Wellbeing:  Category I Pain Control:  Epidural I/D:  n/a Anticipated MOD:  NSVD Start pitocin  Kara [name] 12/26/2011, 12:57 PM

## 2011-12-26 NOTE — Anesthesia Procedure Notes (Signed)

## 2011-12-27 ENCOUNTER — Encounter (HOSPITAL_COMMUNITY): Payer: Self-pay | Admitting: *Deleted

## 2011-12-27 MED ORDER — RHO D IMMUNE GLOBULIN 1500 UNIT/2ML IJ SOLN
300.0000 ug | Freq: Once | INTRAMUSCULAR | Status: AC
Start: 2011-12-27 — End: 2011-12-27
  Administered 2011-12-27: 300 ug via INTRAMUSCULAR
  Filled 2011-12-27: qty 2

## 2011-12-27 NOTE — Progress Notes (Signed)
Post Partum Day 1 Subjective: no complaints, up ad lib, voiding, tolerating PO and + flatus  Objective: Blood pressure 106/68, pulse 75, temperature 97.6 F (36.4 C), temperature source Oral, resp. rate 20, height 5\' 4"  (1.626 m), weight 99.791 kg (220 lb), last menstrual period 02/25/2011, SpO2 96.00%. Bottle feeding  Physical Exam:  General: alert, cooperative and no distress Lochia: appropriate Uterine Fundus: firm Incision: NA DVT Evaluation: Negative Homan's sign.   Basename 12/26/11 0200  HGB 13.5  HCT 39.2    Assessment/Plan: Plan for discharge tomorrow   LOS: 2 days   Kara [name] 12/27/2011, 9:34 AM

## 2011-12-27 NOTE — Anesthesia Postprocedure Evaluation (Signed)
  Anesthesia Post Note  Patient: Kara [name]  Procedure(s) Performed: * No procedures listed *  Anesthesia type: Epidural  Patient location: Mother/Baby  Post pain: Pain level controlled  Post assessment: Post-op Vital signs reviewed  Last Vitals:  Filed Vitals:   12/27/11 0300  BP: 106/68  Pulse: 75  Temp: 36.4 C  Resp: 20    Post vital signs: Reviewed  Level of consciousness:alert  Complications: No apparent anesthesia complications

## 2011-12-27 NOTE — Progress Notes (Signed)
SW met briefly with parents in MOB's first floor room/124 to introduce myself and complete assessment.  There was clearly some tension between them and SW feels that a better assessment can be completed at another time.  SW explained support services offered by NICU SW and asked parents to contact SW if they have any questions or needs.  Complete assessment to follow. 

## 2011-12-28 LAB — RH IG WORKUP (INCLUDES ABO/RH)
ABO/RH(D): B NEG
Antibody Screen: NEGATIVE
Fetal Screen: NEGATIVE
Unit division: 0

## 2011-12-28 MED ORDER — IBUPROFEN 600 MG PO TABS: 600.0000 mg | ORAL_TABLET | Freq: Four times a day (QID) | ORAL | Status: AC

## 2011-12-28 NOTE — Discharge Instructions (Signed)

## 2011-12-28 NOTE — Discharge Summary (Signed)
Obstetric Discharge Summary Reason for Admission: onset of labor Prenatal Procedures: Maternal Drug abuse (heroine, currently on Suboxone) Intrapartum Procedures: spontaneous vaginal delivery Postpartum Procedures: none Complications-Operative and Postpartum: none Hemoglobin  Date Value Range Status  12/26/2011 13.5  12.0-15.0 (g/dL) Final     HCT  Date Value Range Status  12/26/2011 39.2  36.0-46.0 (%) Final    Physical Exam:  General: alert, cooperative, appears stated age and no distress Lochia: appropriate Uterine Fundus: firm DVT Evaluation: No evidence of DVT seen on physical exam.  Discharge Diagnoses: Term Pregnancy-delivered  Discharge Information: Date: 12/28/2011 Activity: unrestricted and pelvic rest Diet: routine Medications: PNV, Ibuprofen and Continue home medications of Xanax, Suboxone, adn Zoloft Condition: stable Instructions: refer to practice specific booklet Discharge to: home   Newborn Data: Live born female  Birth Weight: 7 lb 6.7 oz (3365 g) APGAR: 7, 9  Newborn in NICU.  MERRELL, DAVID, MD 12/28/2011, 9:01 AM  Attestation of Attending Supervision of Resident: Evaluation and management procedures were performed by the [facility] Resident under my supervision.  I have seen and examined the patient, reviewed the resident's note and chart, and I agree with management and plan.   Jaynie Collins, M.D. 12/28/2011 9:19 AM

## 2011-12-30 NOTE — Progress Notes (Signed)
Clinical Social Work Department PSYCHOSOCIAL ASSESSMENT - MATERNAL/CHILD 12/30/2011  Patient:  [name],[name]  Account Number:  [id number]  Admit Date:  12/25/2011  Childs Name:   Kara [name]    Clinical Social Worker:  Izabel Chim, LCSW   Date/Time:  12/27/2011 05:30 PM  Date Referred:  12/27/2011   Referral source  NICU     Referred reason  NICU  Substance Abuse  Depression/Anxiety   Other referral source:    I:  FAMILY / HOME ENVIRONMENT Child's legal guardian:  PARENT  Guardian - Name Guardian - Age Guardian - Address  Kara [name] [address] [place], [place] [address]  Kara [name]     Other household support members/support persons Name Relationship DOB   GRAND MOTHER    GRANDFATHER   Kara [name] SISTER 04/2009   Other support:   MOB reports having a supportive family.    II  PSYCHOSOCIAL DATA Information Source:  Patient Interview  Financial and Community Resources Employment:   unknown   Financial resources:  Medicaid If Medicaid - County:  [place]  School / Grade:   Maternity Care Coordinator / Child Services Coordination / Early Interventions:  Cultural issues impacting care:   none known    III  STRENGTHS Strengths  Adequate Resources  Compliance with medical plan  Home prepared for Child (including basic supplies)  Supportive family/friends  Understanding of illness   Strength comment:  Parents have been through this situation before and known what to expect.   IV  RISK FACTORS AND CURRENT PROBLEMS Current Problem:  YES   Risk Factor & Current Problem Patient Issue Family Issue Risk Factor / Current Problem Comment  Mental Illness Y N Dep/Anx   N N     V  SOCIAL WORK ASSESSMENT SW met with parents to complete assessment and evaluate how they are coping with baby's admission to NICU.  MOB was pleasant, but fairly quiet.  FOB looked down and did not say anything.  MOB states that she thinks things will be easier this time  since she had this experience before with her first child.  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  Her goal is to wean off completely.  She states that she thinks baby is withdrawing more from the Xanax she was taking than the Suboxone.  She states she has been prescribed 1mg 3x per day.  She sees Dr. Corrington at [facility] and has a counselor named Amy at [facility].  She states she likes both of these professionals and feels like her mental health and substance addiction are being well treated.  She plans to talk with her doctor about transitioning to Zoloft from Xanax.  She reports having all supplies for baby at home and a good support system.  SW asked if there is anything she feels like talking about or is concerned about.  She states she and FOB do not get along and that is stressful. SW plans to talk to MOB more about this when FOB isn't present.  It was obvious that there was tension between them.  He is the father of both of her children.  MOB reports no issues with transportation.  SW explained support services offered by NICU SW and gave contact information.      VI SOCIAL WORK PLAN Social Work Plan  Psychosocial Support/Ongoing Assessment of   Needs   Type of pt/family education:   If child protective services report - county:   If child protective services report - date:   Information/referral to community resources comment:   Other social work plan:   SW will monitor drug screens.      

## 2012-01-20 NOTE — H&P (Signed)
Admit history reviewed. I agree with documentation and plan.

## 2012-09-16 ENCOUNTER — Emergency Department (HOSPITAL_BASED_OUTPATIENT_CLINIC_OR_DEPARTMENT_OTHER)
Admission: EM | Admit: 2012-09-16 | Discharge: 2012-09-16 | Disposition: A | Discharge status: Home / Self Care | Payer: Medicaid Other | Attending: Emergency Medicine | Admitting: Emergency Medicine

## 2012-09-16 ENCOUNTER — Emergency Department (HOSPITAL_BASED_OUTPATIENT_CLINIC_OR_DEPARTMENT_OTHER): Payer: Medicaid Other

## 2012-09-16 ENCOUNTER — Encounter (HOSPITAL_BASED_OUTPATIENT_CLINIC_OR_DEPARTMENT_OTHER): Payer: Self-pay | Admitting: Family Medicine

## 2012-09-16 DIAGNOSIS — F1210 Cannabis abuse, uncomplicated: Secondary | ICD-10-CM | POA: Insufficient documentation

## 2012-09-16 DIAGNOSIS — S60219A Contusion of unspecified wrist, initial encounter: Secondary | ICD-10-CM

## 2012-09-16 DIAGNOSIS — Y92410 Unspecified street and highway as the place of occurrence of the external cause: Secondary | ICD-10-CM | POA: Insufficient documentation

## 2012-09-16 DIAGNOSIS — Z8619 Personal history of other infectious and parasitic diseases: Secondary | ICD-10-CM | POA: Insufficient documentation

## 2012-09-16 DIAGNOSIS — Y9389 Activity, other specified: Secondary | ICD-10-CM | POA: Insufficient documentation

## 2012-09-16 DIAGNOSIS — F17200 Nicotine dependence, unspecified, uncomplicated: Secondary | ICD-10-CM | POA: Insufficient documentation

## 2012-09-16 DIAGNOSIS — Z79899 Other long term (current) drug therapy: Secondary | ICD-10-CM | POA: Insufficient documentation

## 2012-09-16 MED ORDER — IBUPROFEN 800 MG PO TABS: 800.0000 mg | ORAL_TABLET | Freq: Three times a day (TID) | ORAL | Status: DC

## 2012-09-16 MED ORDER — IBUPROFEN 800 MG PO TABS
800.0000 mg | ORAL_TABLET | Freq: Once | ORAL | Status: AC
  Administered 2012-09-16: 800 mg via ORAL
  Filled 2012-09-16: qty 1
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
## 2012-11-05 IMAGING — US US OB DETAIL+14 WK
1 series · 12 of 28 positions shown · non-contrast
Comparison: none

[Series 1: us ob detail +14 wk · 12 of 73 slices shown]
[im 3/73]
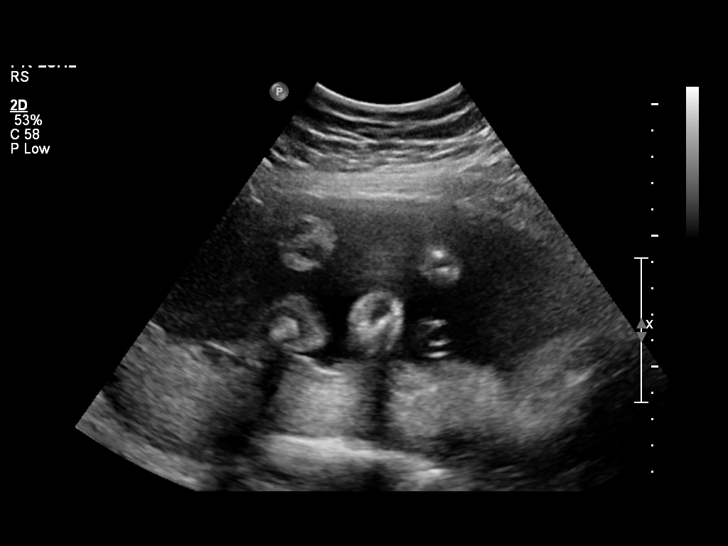
[im 9/73]
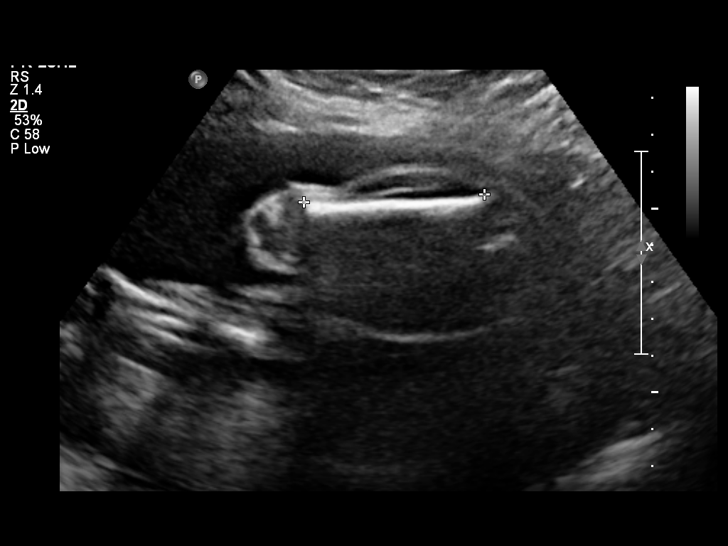
[im 14/73]
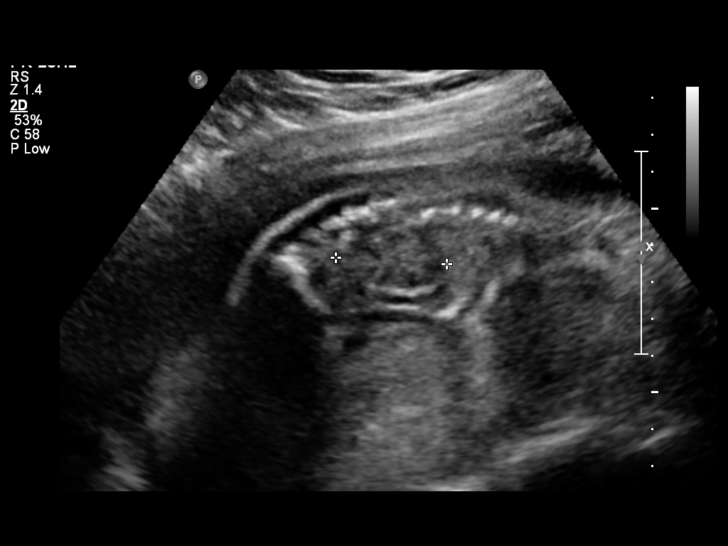
[im 22/73]
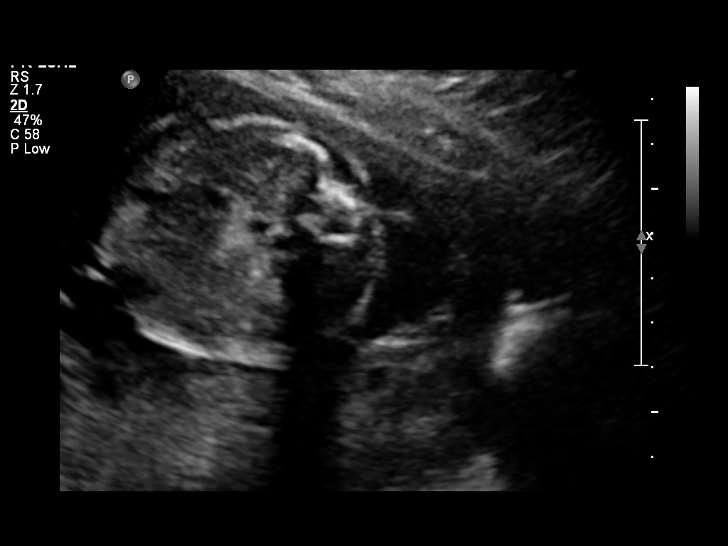
[im 27/73]
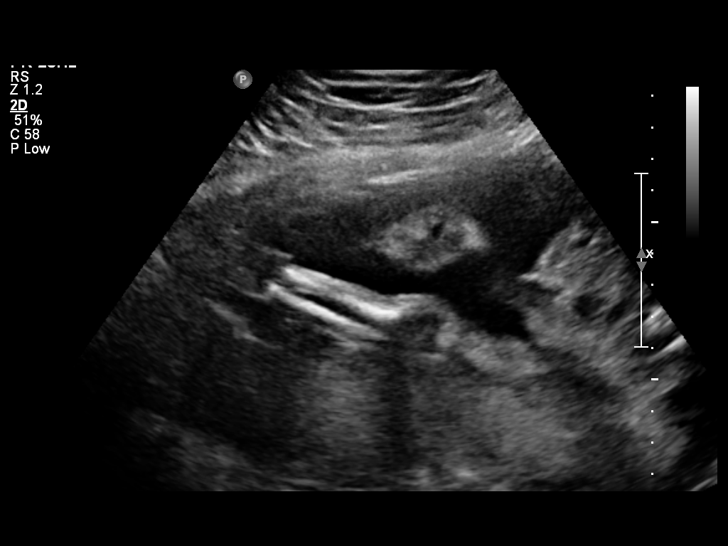
[im 33/73]
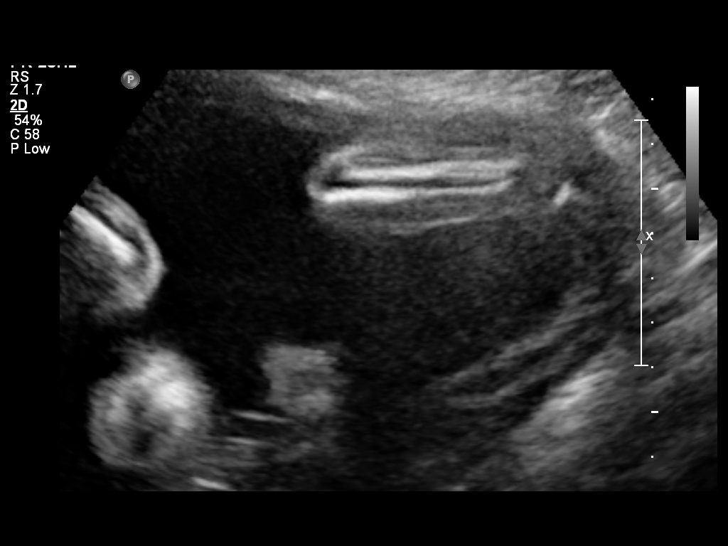
[im 41/73]
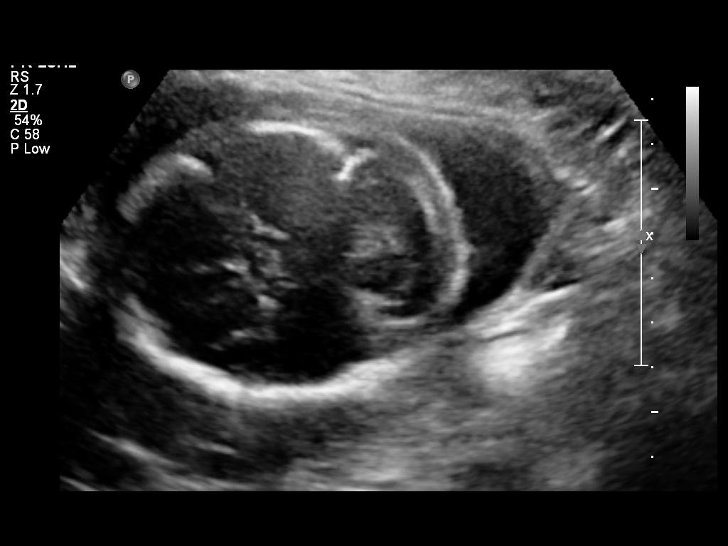
[im 46/73]
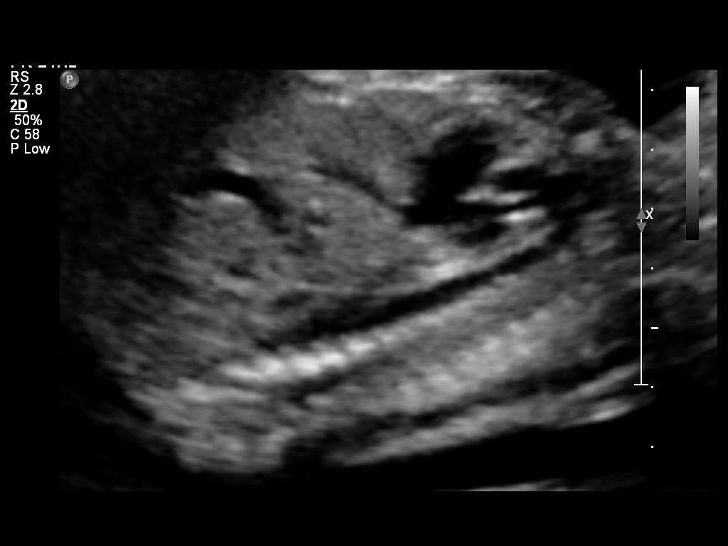
[im 51/73]
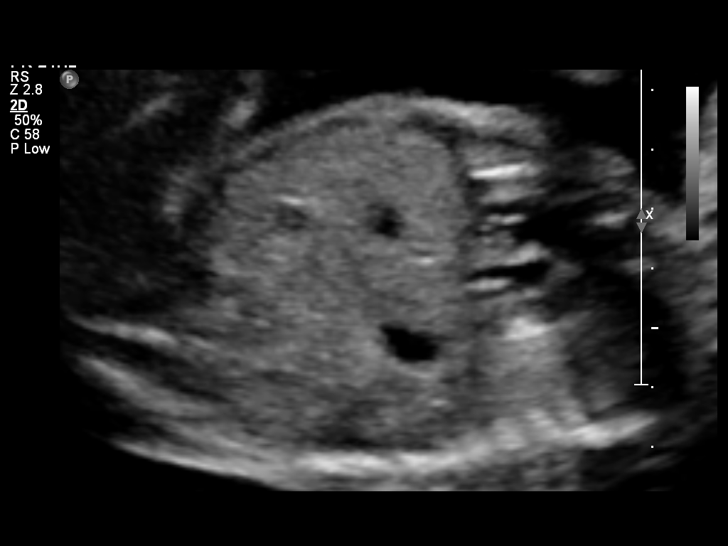
[im 59/73]
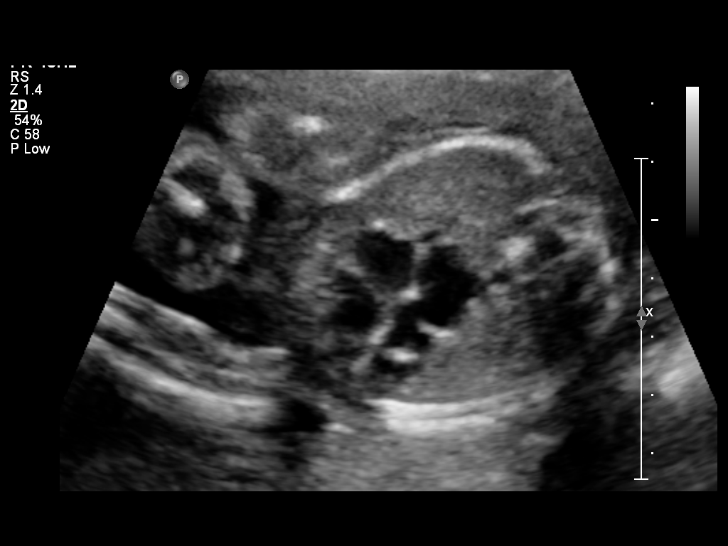
[im 65/73]
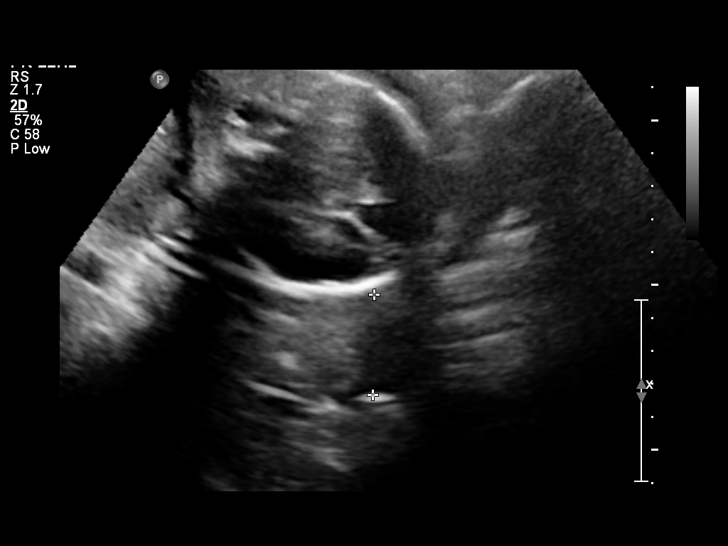
[im 70/73]
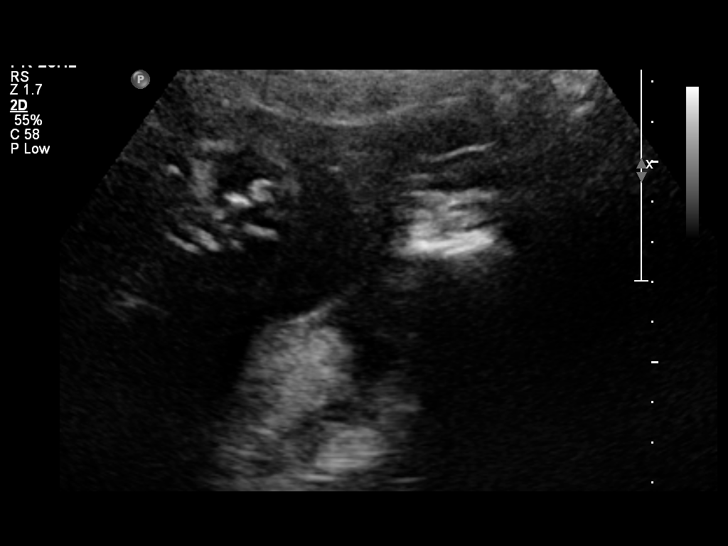

[12 of 28 positions shown; findings below may reference images not displayed]

OBSTETRICS REPORT
                      (Signed Final 09/17/2011 [DATE])

 Order#:         [id number]
Procedures

 US OB DETAIL + 14 WK                                  76811.0
Indications

 Detailed fetal anatomic survey
Fetal Evaluation

 Fetal Heart Rate:  139                         bpm
 Cardiac Activity:  Observed
 Presentation:      Cephalic
 Placenta:          Posterior, above cervical
                    os
 P. Cord            Visualized
 Insertion:

 Amniotic Fluid
 AFI FV:      Subjectively within normal limits
                                             Larg Pckt:     4.7  cm
Biometry

 BPD:     63.5  mm    G. Age:   25w 5d                CI:        75.13   70 - 86
                                                      FL/HC:      21.4   18.6 -

 HC:     232.4  mm    G. Age:   25w 2d       19  %    HC/AC:      1.14   1.04 -

 AC:     203.6  mm    G. Age:   25w 0d       24  %    FL/BPD:     78.3   71 - 87
 FL:      49.7  mm    G. Age:   26w 5d       73  %    FL/AC:      24.4   20 - 24
 HUM:     43.5  mm    G. Age:   25w 6d       54  %
 Est. FW:     841  gm    1 lb 14 oz      56  %
Gestational Age

 LMP:           29w 1d       Date:   02/25/11                 EDD:   12/02/11
 U/S Today:     25w 5d                                        EDD:   12/26/11
 Best:          25w 4d    Det. By:   U/S (07/02/11)           EDD:   12/27/11
Genetic Sonogram - Trisomy 21 Screening

 Age:                                             25          Risk=1:   885
 Echogenic bowel:                                 No          LR :
 Hypoplastic/absent Nasal bone:                   No
 Choroid plexus cysts:                            No
 Structural anomalies (inc. cardiac):             No          LR :
 Hypoplastic / absent midphalanx 5th Digit:       No
 Short femur:                                     No          LR :
 Wide space 9st-4nd toes:                         No
 Short humerus:                                   No          LR :
 2-vessel umbilical cord:                         No
 Pyelectasis:                                     No          LR :
 Echogenic cardiac foci:                          Yes         LR :

 11 Of 11 Criteria Were Visualized and 1 Abnormal(s) Were Seen.
 Ultrasound Modified Risk for Fetal Down Syndrome = [DATE]
Anatomy

 Cranium:           Appears normal      Aortic Arch:       Appears normal
 Fetal Cavum:       Appears normal      Ductal Arch:       Not well
                                                           visualized
 Ventricles:        Appears normal      Diaphragm:         Appears normal
 Choroid Plexus:    Appears normal      Stomach:           Appears normal
 Cerebellum:        Appears normal      Abdomen:           Appears normal
 Posterior Fossa:   Appears normal      Abdominal Wall:    Appears nml
                                                           (cord insert,
                                                           abd wall)
 Nuchal Fold:       Not applicable      Cord Vessels:      Appears normal
                    (>20 wks GA)                           (3 vessel cord)
 Face:              Appears normal      Kidneys:           Appear normal
                    (lips/profile/orbit
                    s)
 Heart:             Echogenic           Bladder:           Appears normal
                    focus in LV
 RVOT:              Appears normal      Spine:             Appears normal
 LVOT:              Appears normal      Limbs:             Four extremities
                                                           seen

 Other:     Female gender. Heels visualized. Nasal bone visualized.
            Technically difficult due to advanced GA and fetal
            position.
Cervix Uterus Adnexa

 Cervical Length:   3         cm

 Cervix:       Normal appearance by transabdominal scan.
 Left Ovary:   Within normal limits.
 Right Ovary:  Within normal limits.

 Adnexa:     No abnormality visualized.
Impression

  Single living intrauterine gestation with concordant
 gestational age and normal visualized anatomy, except for
 note of a left ventricular EIF.  As an isolated finding, EIF
 mildly increases pre-test risk for Trisomy 21.  Please see
 above calculation.

## 2012-12-20 IMAGING — US US OB FOLLOW-UP
1 series · 14 of 28 positions shown · non-contrast
Comparison: none

[Series 1: us ob follow-up · 0.26mm/px · 69 acquisitions, 14 frames shown]
[im 3/69]
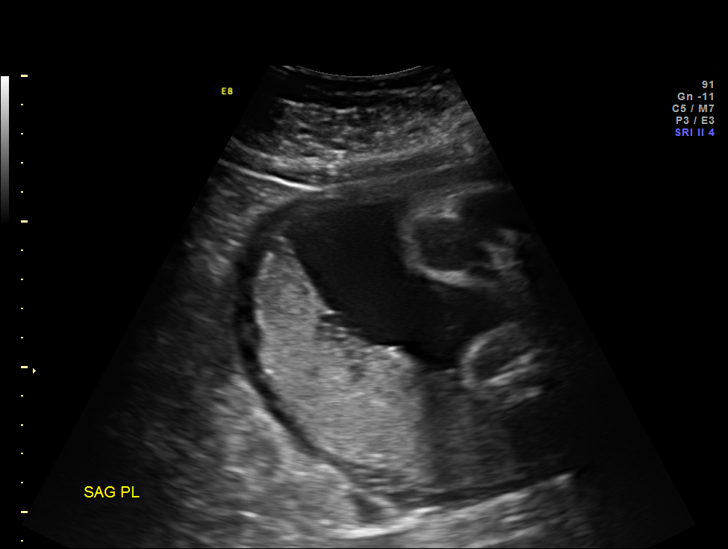
[im 8/69]
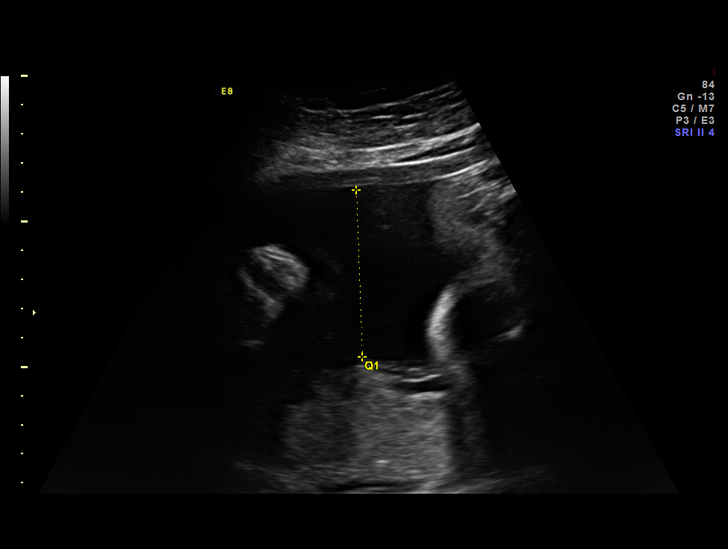
[im 13/69]
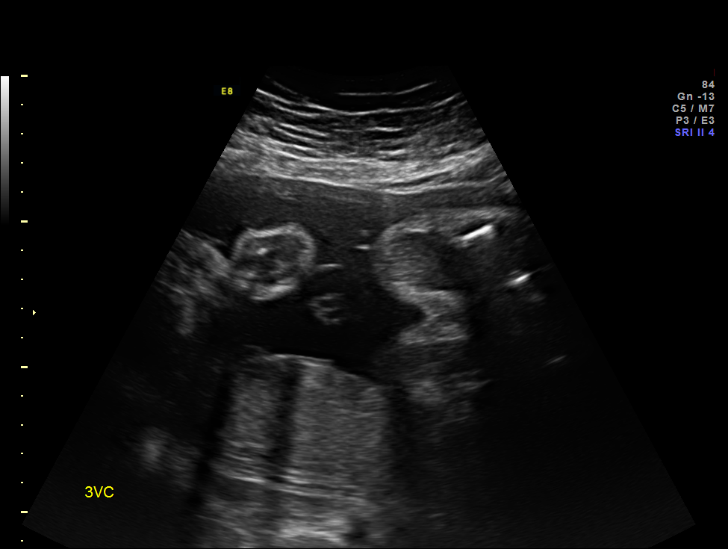
[im 18/69]
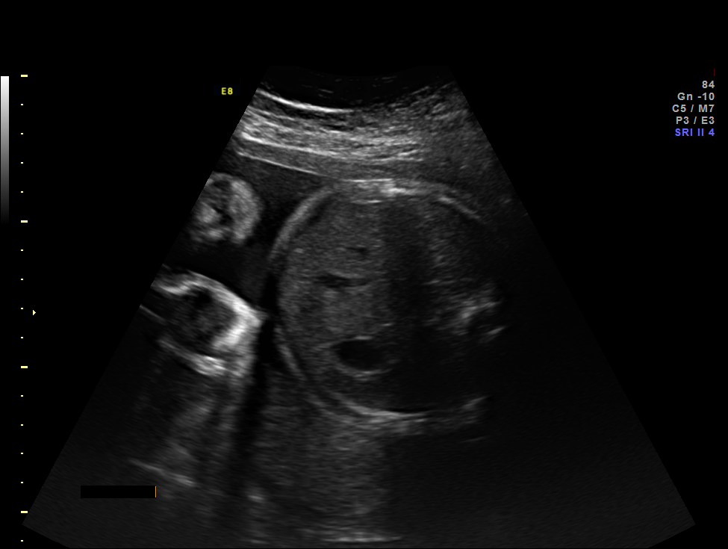
[im 23/69]
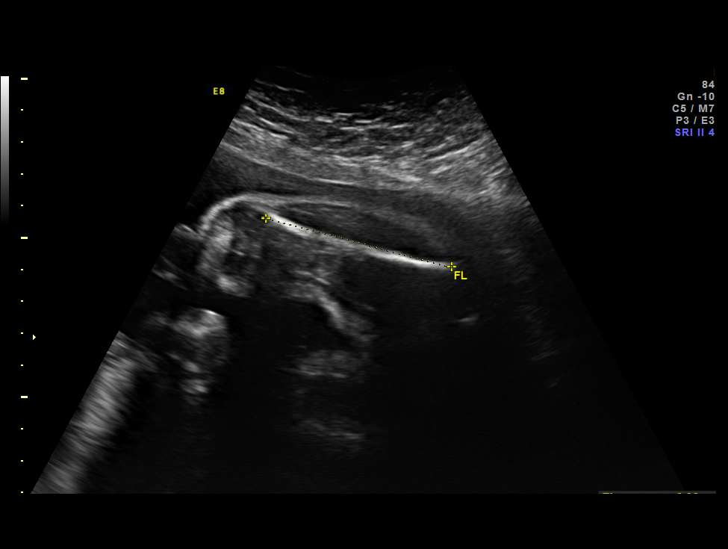
[im 28/69]
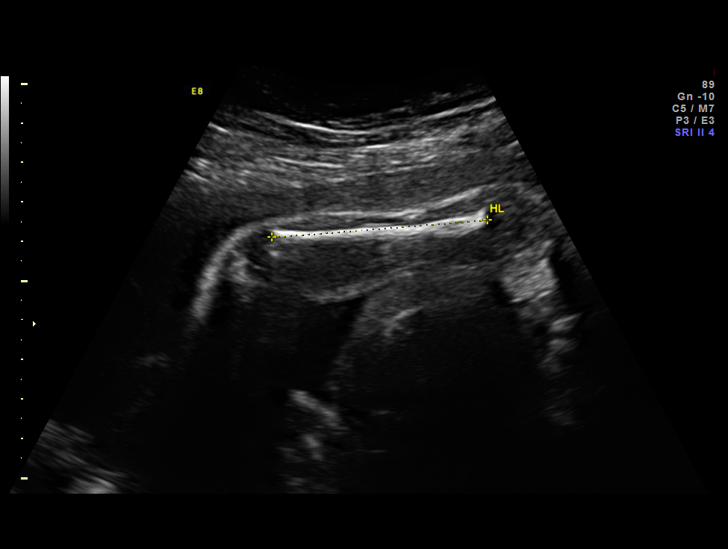
[im 33/69]
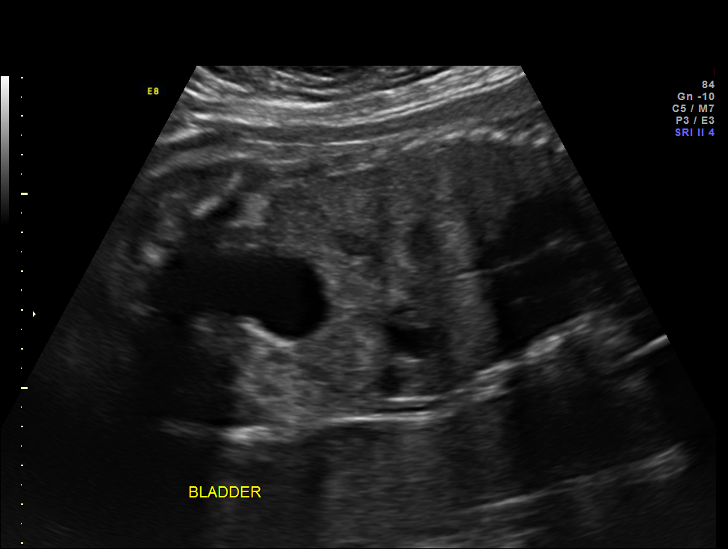
[im 38/69]
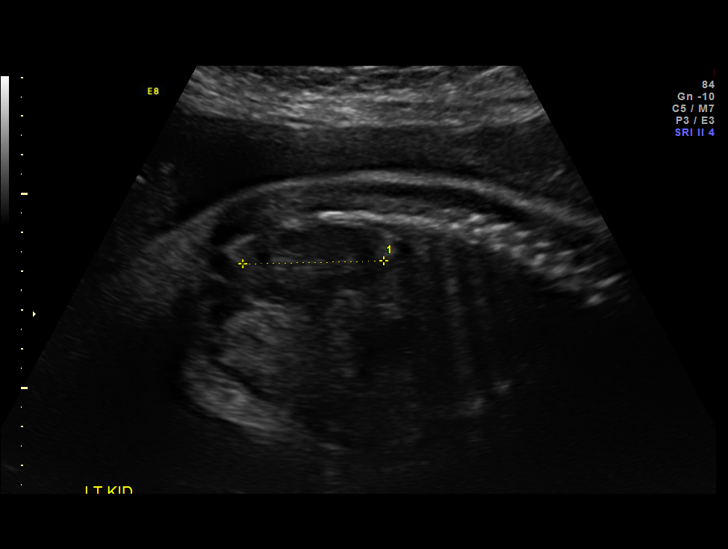
[im 43/69]
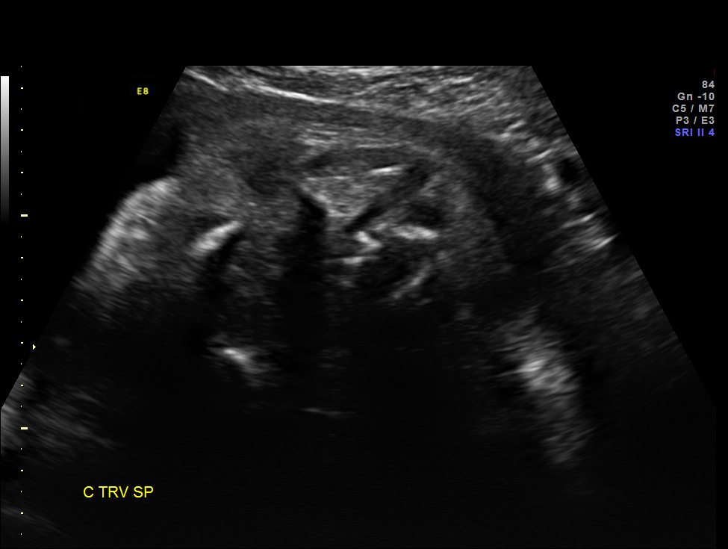
[im 48/69]
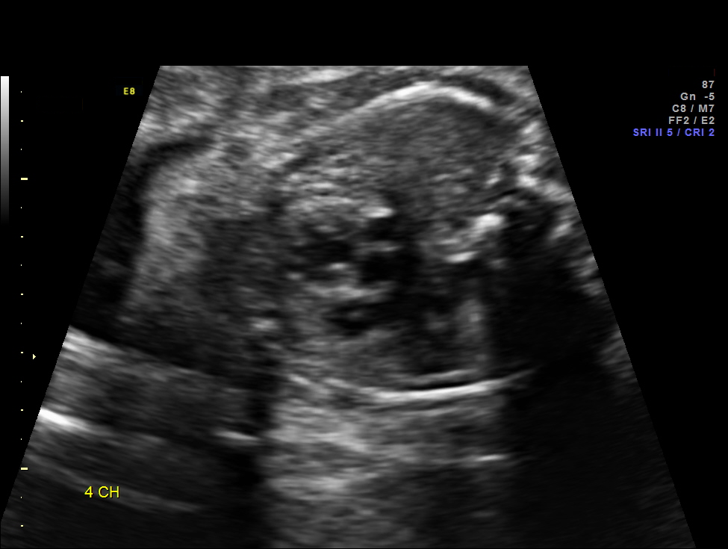
[im 53/69]
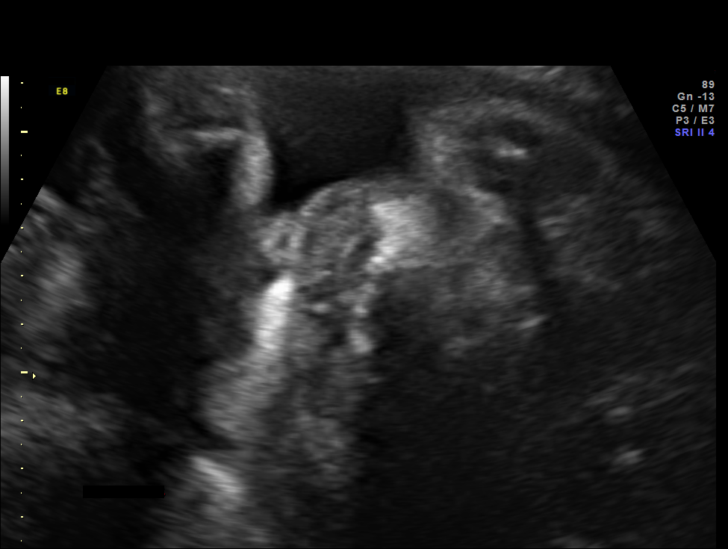
[im 58/69]
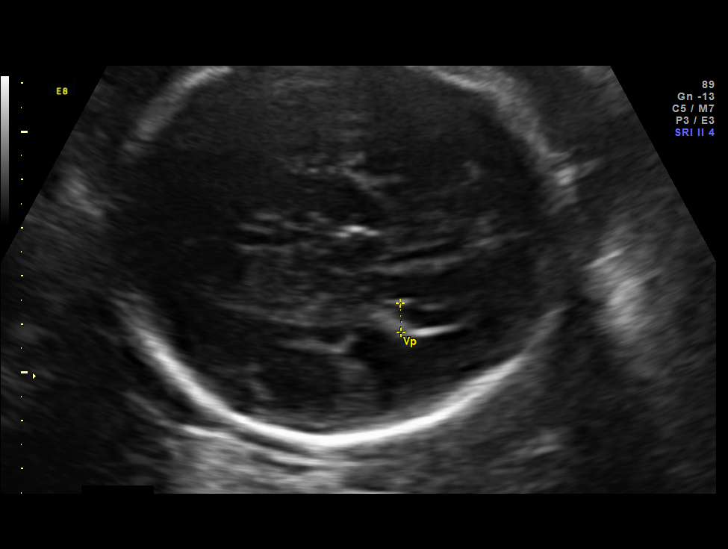
[im 63/69]
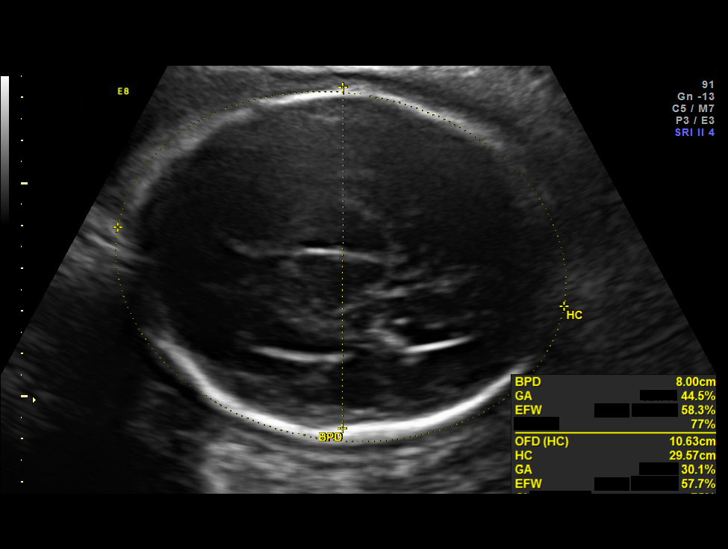
[im 69/69]
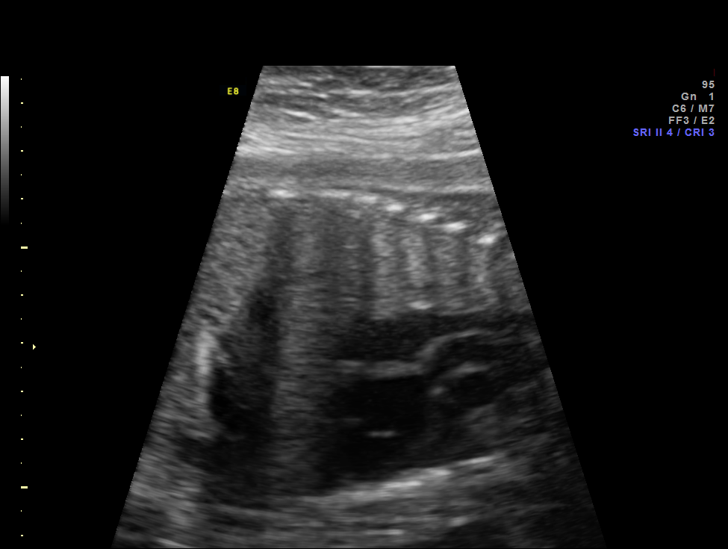

[14 of 28 positions shown; findings below may reference images not displayed]

Canned report from images found in remote index.

Refer to host system for actual result text.

## 2013-01-30 IMAGING — US US OB FOLLOW-UP
1 series · 12 of 27 positions shown · non-contrast
Comparison: none

[Series 1: us ob follow-up · 12 of 27 slices shown]
[im 2/27]
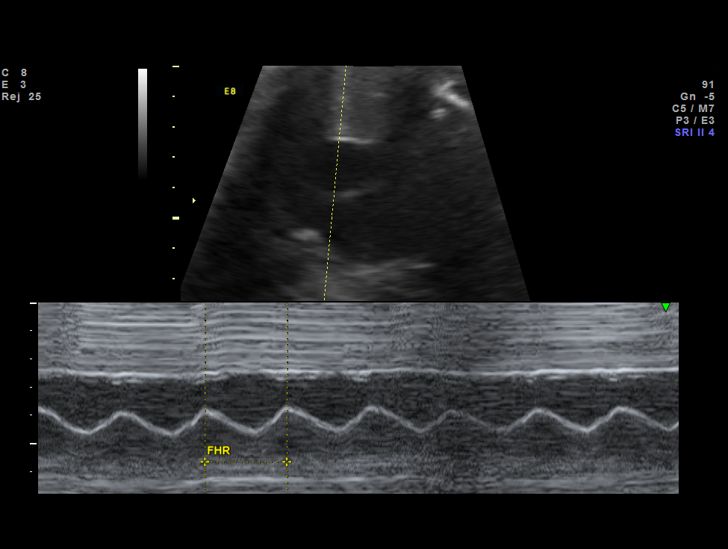
[im 4/27]
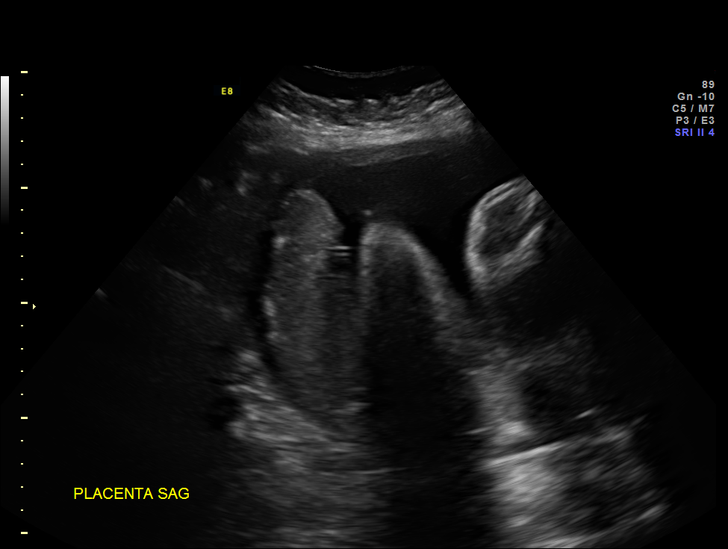
[im 6/27]
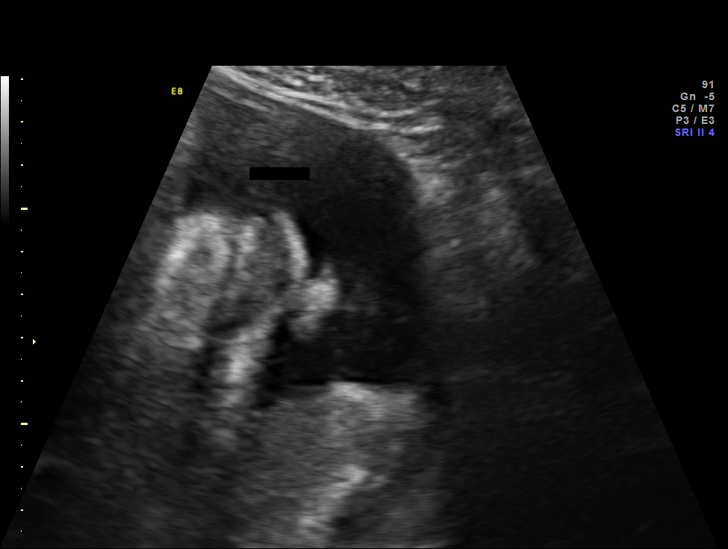
[im 8/27]
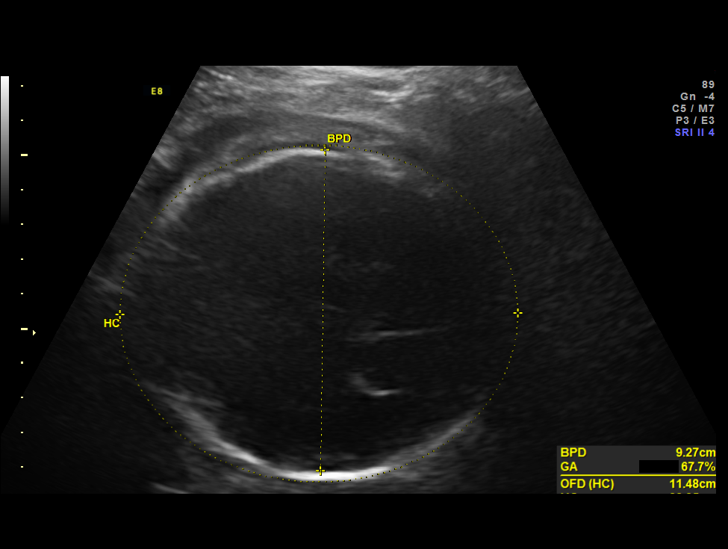
[im 11/27]
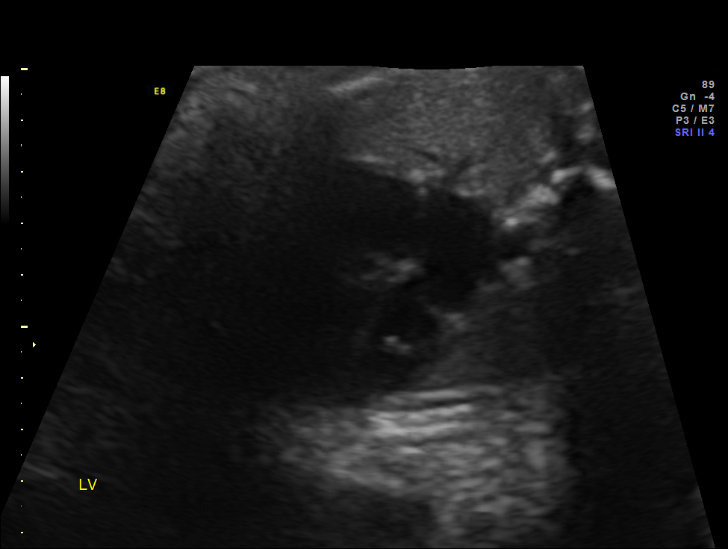
[im 13/27]
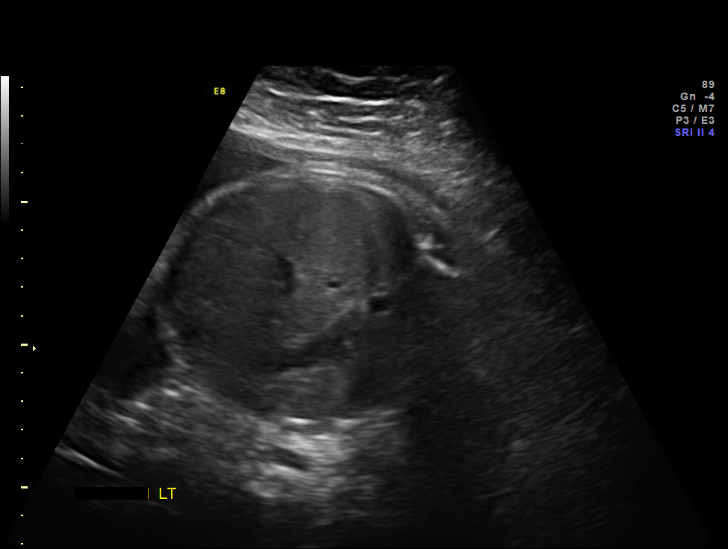
[im 15/27]
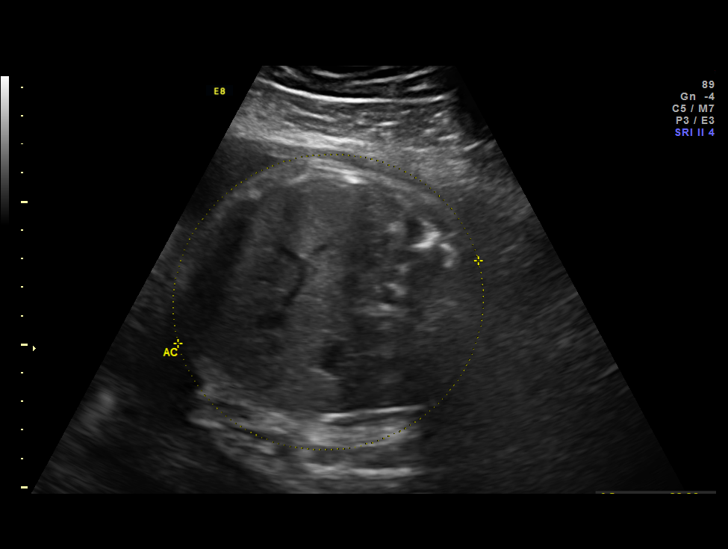
[im 17/27]
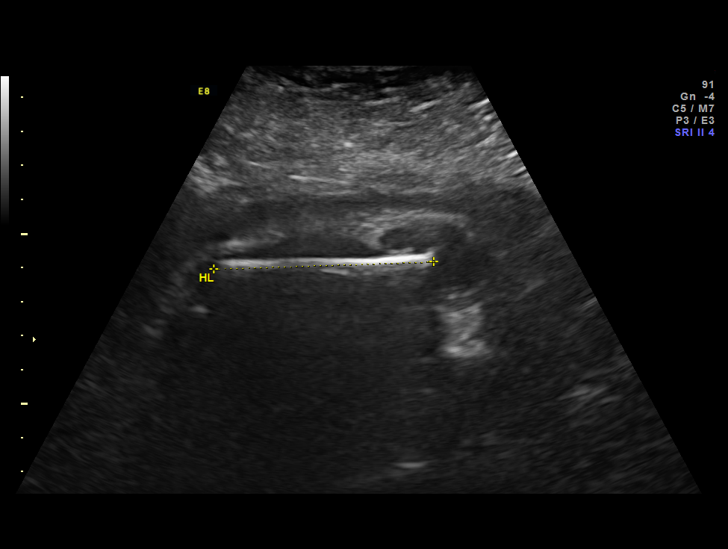
[im 20/27]
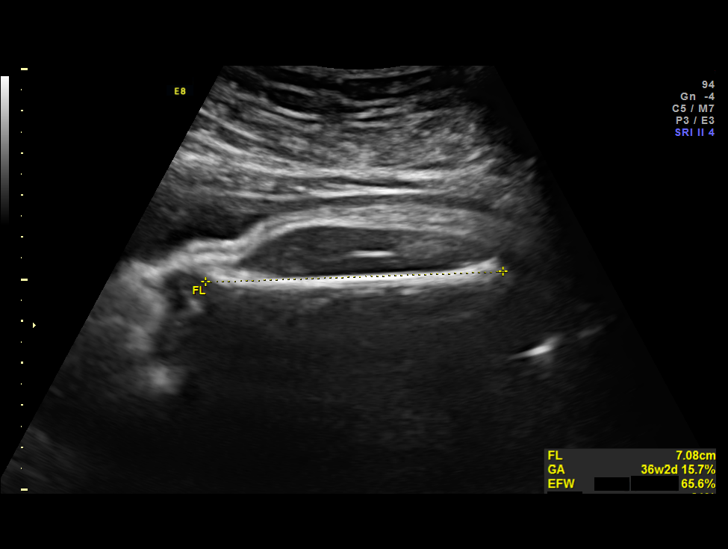
[im 22/27]
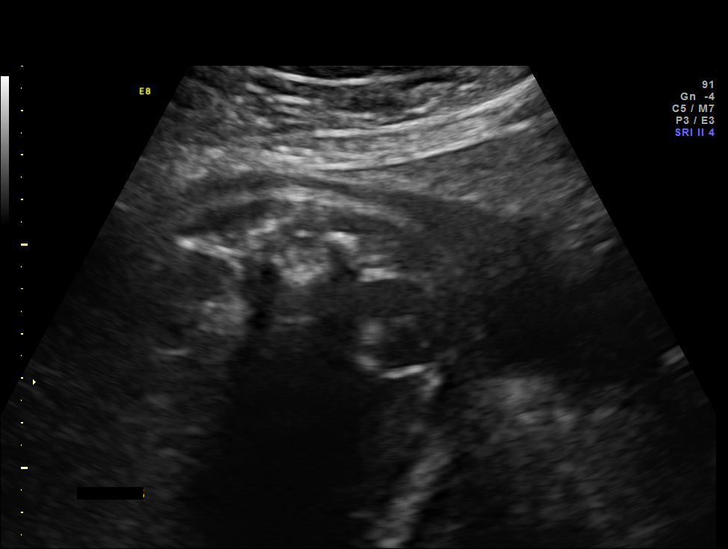
[im 24/27]
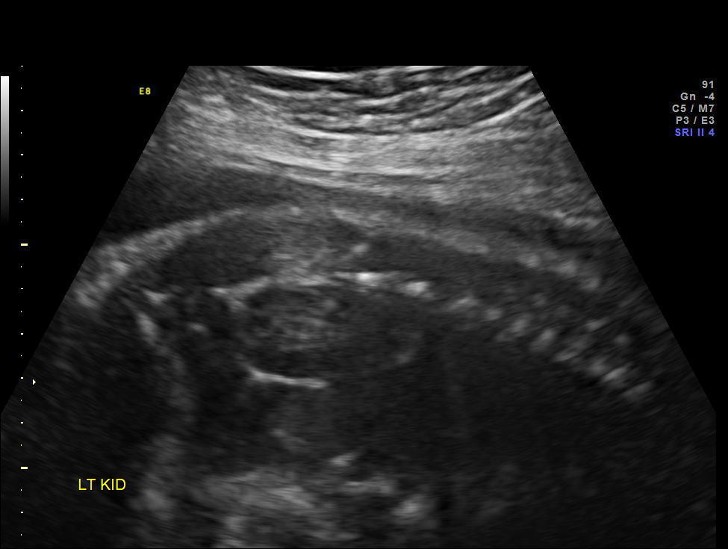
[im 26/27]
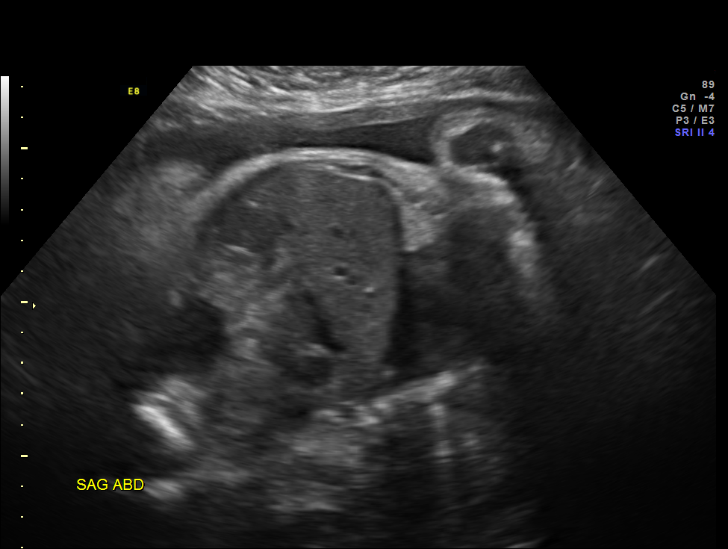

[12 of 27 positions shown; findings below may reference images not displayed]

OBSTETRICS REPORT
                      (Signed Final 12/12/2011 [DATE])

 Order#:         [id number]
Procedures

 US OB FOLLOW UP                                       76816.1
Indications

 Fetal abnormality (echogenic intracardiac focus)
 Poor obstetric history: Previous preterm
 delivery(36 weeks)
 No or Little Prenatal Care
 Hepatitis C
 RH Negative
 Maternal drug exposure (Suboxone, Xanax, Zoloft)
 Assess Fetal Growth / Estimated Fetal Weight
Fetal Evaluation

 Fetal Heart Rate:  123                          bpm
 Cardiac Activity:  Observed
 Presentation:      Cephalic
 Placenta:          Posterior, above cervical
                    os
 P. Cord            Previously Visualized
 Insertion:

 Amniotic Fluid
 AFI FV:      Subjectively within normal limits
                                             Larg Pckt:     5.6  cm
Biometry

 BPD:       93  mm     G. Age:  37w 6d                CI:         80.0   70 - 86
 OFD:    116.2  mm                                    FL/HC:      21.4   20.9 -

 HC:     334.8  mm     G. Age:  38w 2d       40  %    HC/AC:      0.99   0.92 -

 AC:     336.5  mm     G. Age:  37w 4d       58  %    FL/BPD:     77.0   71 - 87
 FL:      71.6  mm     G. Age:  36w 5d       24  %    FL/AC:      21.3   20 - 24
 HUM:     64.2  mm     G. Age:  37w 2d       65  %

 Est. FW:    7580  gm      7 lb 1 oz     68  %
Gestational Age
 LMP:           41w 3d        Date:  02/25/11                 EDD:   12/02/11
 U/S Today:     37w 4d                                        EDD:   12/29/11
 Best:          37w 6d     Det. By:  U/S (07/02/11)           EDD:   12/27/11
Anatomy

 Cranium:           Appears normal      Aortic Arch:       Previously seen
 Fetal Cavum:       Previously seen     Ductal Arch:       Previously seen
 Ventricles:        Appears normal      Diaphragm:         Previously seen
 Choroid Plexus:    Previously seen     Stomach:           Appears
                                                           normal, left
                                                           sided
 Cerebellum:        Previously seen     Abdomen:           Appears normal
 Posterior Fossa:   Previously seen     Abdominal Wall:    Previously seen
 Nuchal Fold:       Not applicable      Cord Vessels:      Previously seen
                    (>20 wks GA)
 Face:              Previously seen     Kidneys:           Appear normal
 Heart:             EIF, Normal         Bladder:           Appears normal
                    4CH & axis
 RVOT:              Previously seen     Spine:             Previously seen
 LVOT:              Previously seen     Limbs:             Previously seen

 Other:     Female gender. Heels previously seen.
Cervix Uterus Adnexa

 Cervix:       Not visualized (advanced GA >29 wks)
Impression

 Single IUP at 37 [DATE] weeks.  Interval growth is appropriate.
Recommendations

 Follow-up as clinically indicated

 Thank you for this referral.

## 2013-07-18 ENCOUNTER — Emergency Department (HOSPITAL_BASED_OUTPATIENT_CLINIC_OR_DEPARTMENT_OTHER)
Admission: EM | Admit: 2013-07-18 | Discharge: 2013-07-18 | Disposition: A | Discharge status: Home / Self Care | Payer: Medicaid Other | Attending: Emergency Medicine | Admitting: Emergency Medicine

## 2013-07-18 ENCOUNTER — Encounter (HOSPITAL_BASED_OUTPATIENT_CLINIC_OR_DEPARTMENT_OTHER): Payer: Self-pay | Admitting: Emergency Medicine

## 2013-07-18 DIAGNOSIS — L0231 Cutaneous abscess of buttock: Secondary | ICD-10-CM | POA: Insufficient documentation

## 2013-07-18 DIAGNOSIS — Z8619 Personal history of other infectious and parasitic diseases: Secondary | ICD-10-CM | POA: Insufficient documentation

## 2013-07-18 DIAGNOSIS — L0291 Cutaneous abscess, unspecified: Secondary | ICD-10-CM

## 2013-07-18 DIAGNOSIS — Z79899 Other long term (current) drug therapy: Secondary | ICD-10-CM | POA: Insufficient documentation

## 2013-07-18 DIAGNOSIS — F17200 Nicotine dependence, unspecified, uncomplicated: Secondary | ICD-10-CM | POA: Insufficient documentation

## 2013-07-18 MED ORDER — SULFAMETHOXAZOLE-TRIMETHOPRIM 800-160 MG PO TABS: 1.0000 | ORAL_TABLET | Freq: Two times a day (BID) | ORAL | Status: DC

## 2013-07-18 MED ORDER — CEPHALEXIN 500 MG PO CAPS: 500.0000 mg | ORAL_CAPSULE | Freq: Four times a day (QID) | ORAL | Status: DC

## 2013-07-18 NOTE — ED Provider Notes (Signed)
CSN: [id number]     Arrival date & time 07/18/13  1942 History  This chart was scribed for Charles B. Bernette Mayers, MD by Leone Payor, ED Scribe. This patient was seen in room MH02/MH02 and the patient's care was started 7:52 PM.   Chief Complaint  Patient presents with  . Abscess    The history is provided by the patient. No language interpreter was used.    HPI Comments: Kara [name] is a 27 y.o. female who presents to the Emergency Department complaining of 2 weeks of gradual onset, gradually worsening, constant abscess to the left buttocks. Pt's friend states he squeezed the area to help drain the abscess. She denies fever or chills.    Past Medical History  Diagnosis Date  . Hepatitis C     Dx 2009  . Infection   . STD (female)   . Preterm labor    Past Surgical History  Procedure Laterality Date  . Extraction of wisdome teeth      age 76   Family History  Problem Relation Age of Onset  . Adopted: Yes   History  Substance Use Topics  . Smoking status: Current Some Day Smoker -- 0.50 packs/day for 4 years    Types: Cigarettes  . Smokeless tobacco: Never Used  . Alcohol Use: Yes   OB History   Grav Para Term Preterm Abortions TAB SAB Ect Mult Living   4 2 1 1 2 2  0 0 0 2     Review of Systems A complete 10 system review of systems was obtained and all systems are negative except as noted in the HPI and PMH.   Allergies  Review of patient's allergies indicates no known allergies.  Home Medications   Current Outpatient Rx  Name  Route  Sig  Dispense  Refill  . ALPRAZolam (XANAX) 1 MG tablet   Oral   Take 1 mg by mouth 3 (three) times daily as needed. For anxiety         . buprenorphine-naloxone (SUBOXONE) 8-2 MG SUBL   Sublingual   Place 1-2 tablets under the tongue 3 (three) times daily.           Marland Kitchen ibuprofen (ADVIL,MOTRIN) 800 MG tablet   Oral   Take 1 tablet (800 mg total) by mouth 3 (three) times daily.   21 tablet   0   . Prenat w/o A  Vit-FeFum-FePo-FA (CONCEPT OB) 130-92.4-1 MG CAPS   Oral   Take 1 tablet by mouth daily.   30 capsule   12   . EXPIRED: sertraline (ZOLOFT) 50 MG tablet   Oral   Take 1 tablet (50 mg total) by mouth daily. Take 1/2 tablet daily x 1 week, then 1 tablet daily   30 tablet   6    BP 123/56  Pulse 102  Temp(Src) 98.2 F (36.8 C) (Oral)  Resp 18  Ht 5\' 3"  (1.6 m)  Wt 180 lb (81.647 kg)  BMI 31.89 kg/m2  SpO2 99%  LMP 07/03/2013  Breastfeeding? No Physical Exam  Nursing note and vitals reviewed. Constitutional: She is oriented to person, place, and time. She appears well-developed and well-nourished.  HENT:  Head: Normocephalic and atraumatic.  Neck: Neck supple.  Pulmonary/Chest: Effort normal.  Neurological: She is alert and oriented to person, place, and time. No cranial nerve deficit.  Skin: There is erythema.  Spontaneously draining abscess on the right posterior upper thigh with small amount pus expressed. Adjacent area of erythema  and induration but without fluctuance.   Psychiatric: She has a normal mood and affect. Her behavior is normal.    ED Course  Procedures   DIAGNOSTIC STUDIES: Oxygen Saturation is 99% on RA, normal by my interpretation.    COORDINATION OF CARE: 7:56 PM Discussed treatment plan with pt at bedside and pt agreed to plan.   Labs Review Labs Reviewed - No data to display Imaging Review No results found.  EKG Interpretation   None       MDM   1. Abscess     Spontaneously draining abscess with surrounding cellulitis. Bactrim/Keflex. Local wound care. Return for worsening.   I personally performed the services described in this documentation, which was scribed in my presence. The recorded information has been reviewed and is accurate.      Charles B. Bernette Mayers, MD 07/18/13 2010

## 2013-07-18 NOTE — ED Notes (Signed)
Pt reports abscess left buttock onset x 2 weeks increased pain last 24 hrs

## 2013-08-26 NOTE — L&D Delivery Note (Signed)
Delivery Note At 5:14 AM a viable and healthy female was delivered via Vaginal, Spontaneous Delivery (Presentation: Right Occiput Anterior).  APGAR: 8, 9; weight pending.   Placenta status: Intact, Spontaneous.  Cord: 3 vessels with a loose nuchal  Anesthesia: Epidural  Episiotomy: None Lacerations: None Suture Repair: None Est. Blood Loss (mL): 250  Mom to postpartum.  Baby to Couplet care / Skin to Skin.  Joseluis Alessio H. 04/04/2014, 5:34 AM

## 2014-02-17 ENCOUNTER — Other Ambulatory Visit: Payer: Self-pay

## 2014-02-21 LAB — OB RESULTS CONSOLE HIV ANTIBODY (ROUTINE TESTING): HIV: NONREACTIVE

## 2014-02-21 LAB — OB RESULTS CONSOLE HEPATITIS B SURFACE ANTIGEN: Hepatitis B Surface Ag: NEGATIVE

## 2014-02-21 LAB — OB RESULTS CONSOLE RUBELLA ANTIBODY, IGM: RUBELLA: IMMUNE

## 2014-02-21 LAB — OB RESULTS CONSOLE RPR: RPR: NONREACTIVE

## 2014-02-21 LAB — CYTOLOGY - PAP

## 2014-02-22 ENCOUNTER — Other Ambulatory Visit (HOSPITAL_COMMUNITY): Payer: Self-pay | Admitting: Obstetrics and Gynecology

## 2014-02-22 DIAGNOSIS — Z3689 Encounter for other specified antenatal screening: Secondary | ICD-10-CM

## 2014-02-23 ENCOUNTER — Ambulatory Visit (HOSPITAL_COMMUNITY): Payer: Medicaid Other

## 2014-03-08 ENCOUNTER — Encounter (HOSPITAL_COMMUNITY): Payer: Self-pay

## 2014-03-08 ENCOUNTER — Ambulatory Visit (HOSPITAL_COMMUNITY)
Admission: RE | Admit: 2014-03-08 | Discharge: 2014-03-08 | Disposition: A | Discharge status: Home / Self Care | Payer: Medicaid Other | Source: Ambulatory Visit | Attending: Obstetrics and Gynecology | Admitting: Obstetrics and Gynecology

## 2014-03-08 DIAGNOSIS — O98519 Other viral diseases complicating pregnancy, unspecified trimester: Secondary | ICD-10-CM | POA: Diagnosis not present

## 2014-03-08 DIAGNOSIS — B1920 Unspecified viral hepatitis C without hepatic coma: Secondary | ICD-10-CM | POA: Diagnosis present

## 2014-03-08 DIAGNOSIS — Z3689 Encounter for other specified antenatal screening: Secondary | ICD-10-CM | POA: Diagnosis not present

## 2014-03-08 DIAGNOSIS — O99330 Smoking (tobacco) complicating pregnancy, unspecified trimester: Secondary | ICD-10-CM | POA: Insufficient documentation

## 2014-03-08 NOTE — Consult Note (Signed)
MFM consult  28 yr old [id number] at [redacted]w[redacted]d referred by Dr. Henderson Cloud for consult secondary to hepatitis C and suboxone use in pregnancy.  Past Ob hx: 2010 preterm delivery at 36 weeks SVD; 2013 full term SVD  PMH: hepatitis C- diagnosed 2009- no treatment; not currently followed by GI PSH: none  Social hx: tobacco- 1/2ppd; no alcohol or illicit drugs Family hx: none that patient knows of- patient is adopted Medications: suboxone, PNV Allergies: NKDA Patient reports this pregnancy has been uncomplicated  28 yr old [id number] at [redacted]w[redacted]d with hepatitis C and on suboxone maintanence:  I counseled the patient as follows: 1. Hepatitis C: pregnancy is usually well tolerated in women with hepatitis C. There is a slightly higher risk of fetal growth restriction. Recommend serial fetal growth. The patient currently reports no symptoms or complications from her hepatitis C; recent viral load was over 2,000,000 however LFTs were normal. Liver disease may progress in pregnancy but it is not likely to be clinically significant. I would recommend obtaining liver function tests every trimester to evaluate liver function. There is an approximate 5-10% risk of perinatal transmission that is related to the presence of hepatitis C viral RNA. Antiviral therapy is not usually recommended in pregnancy and as mentioned the patient is currently asymptomatic. Delivery via C section does not appear to decrease the risk of transmission and would reserve decision on mode of delivery by obstetrical indications.  I would not recommend internal monitoring or operative delivery as these can increase the risk of perinatal transmission. Inform Pediatricians at delivery. Recommend referral to GI.  2. Suboxone maintenance: Has been on suboxone since 2009 and has not used illicit drugs since. - in patients who have an addiction to narcotics or have chronic pain, suboxone maintenance therapy has been shown to decrease adverse events in  pregnant women when compared to continuing non controlled doses of narcotics. A stable suboxone dose reduces stress on the fetus from repeated withdrawal due to inconsistent availability of illicit drugs. Additional benefits include a potential reduction in drug-seeking behaviors  - it is unclear if suboxone use is associated with teratogenicity; there have been some reports to suggest a slight increase in congenital anomalies with suboxone use  - there is an increased risk of preterm delivery, fetal growth restriction, and NICU admission with suboxone therapy in pregnancy  - there is a risk of neonatal abstinence syndrome. I explained the neonate will be closely monitored for signs/symptoms of withdrawal and will be treated as necessary. The neonates are often treated with opiates and slowly weaned- a process that can take days to weeks  - the recommendations are to continue a maintenance dose of suboxone in pregnancy as stopping suboxone in pregnancy has a higher risk of withdrawal, fetal death, and risk of use of illicit drugs  - suboxone should be continued after delivery. Additional pain medication should be given as indicated. Suboxone should not be used to treat pain related to delivery. Nubain should be avoided given it is a partial opiate antagonist.   I recommend serial ultrasounds for fetal growth every 4 weeks. Antenatal testing should be started only in the setting of fetal growth restriction.   3. Previous preterm delivery: - discussed increased risk of recurrence - recommend preterm labor precautions  4. Tobacco use: - recommend cessation - patient reports has cut down significantly - there are increased risks of abruption, preterm delivery, SIDS, and childhood asthma   Patient had ultrasound today. Please see separate report.  I  spent a total of 45 minutes with the patient of which >50% was in face to face consultation.  Eulis FosterKristen Laquisha Northcraft, MD

## 2014-03-15 ENCOUNTER — Other Ambulatory Visit: Payer: Self-pay | Admitting: Obstetrics and Gynecology

## 2014-03-16 LAB — OB RESULTS CONSOLE GC/CHLAMYDIA
CHLAMYDIA, DNA PROBE: NEGATIVE
Gonorrhea: NEGATIVE

## 2014-03-18 LAB — OB RESULTS CONSOLE GBS: GBS: POSITIVE

## 2014-04-03 ENCOUNTER — Inpatient Hospital Stay (HOSPITAL_COMMUNITY): Payer: Medicaid Other | Admitting: Anesthesiology

## 2014-04-03 ENCOUNTER — Encounter (HOSPITAL_COMMUNITY): Payer: Self-pay | Admitting: *Deleted

## 2014-04-03 ENCOUNTER — Encounter (HOSPITAL_COMMUNITY): Payer: Medicaid Other | Admitting: Anesthesiology

## 2014-04-03 ENCOUNTER — Inpatient Hospital Stay (HOSPITAL_COMMUNITY)
Admission: AD | Admit: 2014-04-03 | Discharge: 2014-04-06 | DRG: 775 | Disposition: A | Discharge status: Home / Self Care | Payer: Medicaid Other | Source: Ambulatory Visit | Attending: Obstetrics and Gynecology | Admitting: Obstetrics and Gynecology

## 2014-04-03 DIAGNOSIS — B1920 Unspecified viral hepatitis C without hepatic coma: Secondary | ICD-10-CM | POA: Diagnosis present

## 2014-04-03 DIAGNOSIS — F329 Major depressive disorder, single episode, unspecified: Secondary | ICD-10-CM | POA: Diagnosis present

## 2014-04-03 DIAGNOSIS — O4290 Premature rupture of membranes, unspecified as to length of time between rupture and onset of labor, unspecified weeks of gestation: Secondary | ICD-10-CM | POA: Diagnosis present

## 2014-04-03 DIAGNOSIS — Z6835 Body mass index (BMI) 35.0-35.9, adult: Secondary | ICD-10-CM | POA: Diagnosis not present

## 2014-04-03 DIAGNOSIS — F3289 Other specified depressive episodes: Secondary | ICD-10-CM | POA: Diagnosis present

## 2014-04-03 DIAGNOSIS — O99892 Other specified diseases and conditions complicating childbirth: Secondary | ICD-10-CM | POA: Diagnosis present

## 2014-04-03 DIAGNOSIS — O479 False labor, unspecified: Secondary | ICD-10-CM | POA: Diagnosis present

## 2014-04-03 DIAGNOSIS — F1210 Cannabis abuse, uncomplicated: Secondary | ICD-10-CM | POA: Diagnosis present

## 2014-04-03 DIAGNOSIS — O9989 Other specified diseases and conditions complicating pregnancy, childbirth and the puerperium: Secondary | ICD-10-CM

## 2014-04-03 DIAGNOSIS — O99334 Smoking (tobacco) complicating childbirth: Secondary | ICD-10-CM | POA: Diagnosis present

## 2014-04-03 DIAGNOSIS — Z22330 Carrier of Group B streptococcus: Secondary | ICD-10-CM

## 2014-04-03 DIAGNOSIS — O26619 Liver and biliary tract disorders in pregnancy, unspecified trimester: Secondary | ICD-10-CM | POA: Diagnosis present

## 2014-04-03 DIAGNOSIS — O99214 Obesity complicating childbirth: Secondary | ICD-10-CM

## 2014-04-03 DIAGNOSIS — IMO0001 Reserved for inherently not codable concepts without codable children: Secondary | ICD-10-CM

## 2014-04-03 DIAGNOSIS — O0930 Supervision of pregnancy with insufficient antenatal care, unspecified trimester: Secondary | ICD-10-CM | POA: Diagnosis not present

## 2014-04-03 DIAGNOSIS — E669 Obesity, unspecified: Secondary | ICD-10-CM | POA: Diagnosis present

## 2014-04-03 DIAGNOSIS — O99344 Other mental disorders complicating childbirth: Secondary | ICD-10-CM | POA: Diagnosis present

## 2014-04-03 HISTORY — DX: Unspecified abnormal cytological findings in specimens from vagina: R87.629

## 2014-04-03 LAB — CBC
HEMATOCRIT: 35.2 % — AB (ref 36.0–46.0)
Hemoglobin: 12.2 g/dL (ref 12.0–15.0)
MCH: 31.9 pg (ref 26.0–34.0)
MCHC: 34.7 g/dL (ref 30.0–36.0)
MCV: 92.1 fL (ref 78.0–100.0)
Platelets: 140 10*3/uL — ABNORMAL LOW (ref 150–400)
RBC: 3.82 MIL/uL — AB (ref 3.87–5.11)
RDW: 12.8 % (ref 11.5–15.5)
WBC: 10.9 10*3/uL — ABNORMAL HIGH (ref 4.0–10.5)

## 2014-04-03 LAB — RAPID URINE DRUG SCREEN, HOSP PERFORMED
Amphetamines: NOT DETECTED
Barbiturates: NOT DETECTED
Benzodiazepines: NOT DETECTED
Cocaine: NOT DETECTED
Opiates: NOT DETECTED
TETRAHYDROCANNABINOL: POSITIVE — AB

## 2014-04-03 LAB — PROTIME-INR
INR: 1.04 (ref 0.00–1.49)
PROTHROMBIN TIME: 13.6 s (ref 11.6–15.2)

## 2014-04-03 LAB — TYPE AND SCREEN
ABO/RH(D): B NEG
Antibody Screen: POSITIVE
DAT, IgG: NEGATIVE

## 2014-04-03 LAB — HEPATIC FUNCTION PANEL
ALT: 22 U/L (ref 0–35)
AST: 23 U/L (ref 0–37)
Albumin: 2.5 g/dL — ABNORMAL LOW (ref 3.5–5.2)
Alkaline Phosphatase: 140 U/L — ABNORMAL HIGH (ref 39–117)
BILIRUBIN TOTAL: 0.6 mg/dL (ref 0.3–1.2)
Total Protein: 6.1 g/dL (ref 6.0–8.3)

## 2014-04-03 LAB — RPR

## 2014-04-03 LAB — FIBRINOGEN: Fibrinogen: 400 mg/dL (ref 204–475)

## 2014-04-03 LAB — APTT: APTT: 23 s — AB (ref 24–37)

## 2014-04-03 LAB — POCT FERN TEST: POCT FERN TEST: POSITIVE

## 2014-04-03 MED ORDER — OXYCODONE-ACETAMINOPHEN 5-325 MG PO TABS: 1.0000 | ORAL_TABLET | ORAL | Status: DC | PRN

## 2014-04-03 MED ORDER — TERBUTALINE SULFATE 1 MG/ML IJ SOLN: 0.2500 mg | Freq: Once | INTRAMUSCULAR | Status: AC | PRN

## 2014-04-03 MED ORDER — PENICILLIN G POTASSIUM 5000000 UNITS IJ SOLR: 2.5000 10*6.[IU] | INTRAMUSCULAR | Status: DC

## 2014-04-03 MED ORDER — LIDOCAINE HCL (PF) 1 % IJ SOLN
30.0000 mL | INTRAMUSCULAR | Status: DC | PRN
  Filled 2014-04-03: qty 30

## 2014-04-03 MED ORDER — IBUPROFEN 600 MG PO TABS
600.0000 mg | ORAL_TABLET | Freq: Four times a day (QID) | ORAL | Status: DC | PRN
Start: 2014-04-03 — End: 2014-04-04

## 2014-04-03 MED ORDER — PENICILLIN G POTASSIUM 5000000 UNITS IJ SOLR: 5.0000 10*6.[IU] | Freq: Once | INTRAVENOUS | Status: DC

## 2014-04-03 MED ORDER — PENICILLIN G POTASSIUM 5000000 UNITS IJ SOLR
2.5000 10*6.[IU] | INTRAVENOUS | Status: DC
  Administered 2014-04-03 – 2014-04-04 (×4): 2.5 10*6.[IU] via INTRAVENOUS
  Filled 2014-04-03 (×7): qty 2.5

## 2014-04-03 MED ORDER — OXYTOCIN BOLUS FROM INFUSION
500.0000 mL | INTRAVENOUS | Status: DC
  Administered 2014-04-04: 500 mL via INTRAVENOUS

## 2014-04-03 MED ORDER — FENTANYL 2.5 MCG/ML BUPIVACAINE 1/10 % EPIDURAL INFUSION (WH - ANES)
INTRAMUSCULAR | Status: AC
  Filled 2014-04-03: qty 125

## 2014-04-03 MED ORDER — LIDOCAINE HCL (PF) 1 % IJ SOLN: 30.0000 mL | INTRAMUSCULAR | Status: DC | PRN

## 2014-04-03 MED ORDER — LACTATED RINGERS IV SOLN
500.0000 mL | INTRAVENOUS | Status: DC | PRN
  Administered 2014-04-03: 500 mL via INTRAVENOUS

## 2014-04-03 MED ORDER — LACTATED RINGERS IV SOLN
INTRAVENOUS | Status: DC
  Administered 2014-04-03 (×2): via INTRAVENOUS

## 2014-04-03 MED ORDER — OXYTOCIN 40 UNITS IN LACTATED RINGERS INFUSION - SIMPLE MED: 62.5000 mL/h | INTRAVENOUS | Status: DC

## 2014-04-03 MED ORDER — ONDANSETRON HCL 4 MG/2ML IJ SOLN: 4.0000 mg | Freq: Four times a day (QID) | INTRAMUSCULAR | Status: DC | PRN

## 2014-04-03 MED ORDER — ACETAMINOPHEN 325 MG PO TABS: 650.0000 mg | ORAL_TABLET | ORAL | Status: DC | PRN

## 2014-04-03 MED ORDER — LACTATED RINGERS IV SOLN
INTRAVENOUS | Status: DC
  Administered 2014-04-03 – 2014-04-04 (×2): via INTRAUTERINE

## 2014-04-03 MED ORDER — LACTATED RINGERS IV SOLN: INTRAVENOUS | Status: DC

## 2014-04-03 MED ORDER — PENICILLIN G POTASSIUM 5000000 UNITS IJ SOLR
5.0000 10*6.[IU] | Freq: Once | INTRAMUSCULAR | Status: AC
  Administered 2014-04-03: 5 10*6.[IU] via INTRAVENOUS
  Filled 2014-04-03: qty 5

## 2014-04-03 MED ORDER — OXYTOCIN 40 UNITS IN LACTATED RINGERS INFUSION - SIMPLE MED
1.0000 m[IU]/min | INTRAVENOUS | Status: DC
  Administered 2014-04-03: 2 m[IU]/min via INTRAVENOUS
  Filled 2014-04-03: qty 1000

## 2014-04-03 MED ORDER — PHENYLEPHRINE 40 MCG/ML (10ML) SYRINGE FOR IV PUSH (FOR BLOOD PRESSURE SUPPORT): 80.0000 ug | PREFILLED_SYRINGE | INTRAVENOUS | Status: DC | PRN

## 2014-04-03 MED ORDER — CITRIC ACID-SODIUM CITRATE 334-500 MG/5ML PO SOLN: 30.0000 mL | ORAL | Status: DC | PRN

## 2014-04-03 MED ORDER — LIDOCAINE HCL (PF) 1 % IJ SOLN
INTRAMUSCULAR | Status: DC | PRN
  Administered 2014-04-03 (×2): 4 mL

## 2014-04-03 MED ORDER — PHENYLEPHRINE 40 MCG/ML (10ML) SYRINGE FOR IV PUSH (FOR BLOOD PRESSURE SUPPORT)
PREFILLED_SYRINGE | INTRAVENOUS | Status: AC
  Filled 2014-04-03: qty 10

## 2014-04-03 MED ORDER — EPHEDRINE 5 MG/ML INJ: 10.0000 mg | INTRAVENOUS | Status: DC | PRN

## 2014-04-03 MED ORDER — LACTATED RINGERS IV SOLN
500.0000 mL | Freq: Once | INTRAVENOUS | Status: AC
  Administered 2014-04-03: 500 mL via INTRAVENOUS

## 2014-04-03 MED ORDER — DIPHENHYDRAMINE HCL 50 MG/ML IJ SOLN: 12.5000 mg | INTRAMUSCULAR | Status: DC | PRN

## 2014-04-03 MED ORDER — FENTANYL 2.5 MCG/ML BUPIVACAINE 1/10 % EPIDURAL INFUSION (WH - ANES)
14.0000 mL/h | INTRAMUSCULAR | Status: DC | PRN
  Administered 2014-04-03 – 2014-04-04 (×3): 14 mL/h via EPIDURAL
  Filled 2014-04-03 (×2): qty 125

## 2014-04-03 MED ORDER — FENTANYL 2.5 MCG/ML BUPIVACAINE 1/10 % EPIDURAL INFUSION (WH - ANES)
INTRAMUSCULAR | Status: DC | PRN
  Administered 2014-04-03: 14 mL/h via EPIDURAL

## 2014-04-03 MED ORDER — LACTATED RINGERS IV SOLN: 500.0000 mL | INTRAVENOUS | Status: DC | PRN

## 2014-04-03 MED ORDER — IBUPROFEN 600 MG PO TABS: 600.0000 mg | ORAL_TABLET | Freq: Four times a day (QID) | ORAL | Status: DC | PRN

## 2014-04-03 MED ORDER — FLEET ENEMA 7-19 GM/118ML RE ENEM
1.0000 | ENEMA | RECTAL | Status: DC | PRN
Start: 2014-04-03 — End: 2014-04-04

## 2014-04-03 MED ORDER — OXYTOCIN BOLUS FROM INFUSION: 500.0000 mL | INTRAVENOUS | Status: DC

## 2014-04-03 NOTE — H&P (Signed)
Kara [name] is a 28 y.o. female presenting for leaking fluid  28 yo [id number] @ 39+0 presents for leaking fluid and was confirmed to have spontaneously ruptured membranes. Her pregnancy has been complicated by Hepatitis C with a high viral load. Patient was referred to GI during the pregnancy. It is unclear whether she kept this appointment. I don't have a record of the consultation in her office chart. She started prenatal care late at 32 weeks. 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 She has been on Suboxone therapy during this pregnancy. A UDS @ 32 weeks was positive for Marijuana. History OB History   Grav Para Term Preterm Abortions TAB SAB Ect Mult Living   5 2 1 1 2 2  0 0 0 2     Past Medical History  Diagnosis Date  . Hepatitis C     Dx 2009  . Infection   . STD (female)   . Preterm labor    Past Surgical History  Procedure Laterality Date  . Extraction of wisdome teeth      age 28   Family History: family history is not on file. She was adopted. Social History:  reports that she has been smoking Cigarettes.  She has a 2 pack-year smoking history. She has never used smokeless tobacco. She reports that she drinks alcohol. She reports that she uses illicit drugs (Marijuana and Other-see comments).   Prenatal Transfer Tool  Maternal Diabetes: No Genetic Screening: Declined(late prenatal care) Maternal Ultrasounds/Referrals: Normal Fetal Ultrasounds or other Referrals:  Referred to Materal Fetal Medicine  Maternal Substance Abuse:  Yes:  Type: Smoker, Marijuana, Other: Suboxone Significant Maternal Medications:  Suboxone Significant Maternal Lab Results:  Lab values include: Group B Strep positive, Other:  Hep C POSITIVE, last viral load 9,629,5282,443,599 on 02/17/14 Other Comments:  none  ROS: as above  Dilation: 2 (Clear fluid seen during exam; membranes felt) Effacement (%): 70 Station: Ballotable Exam by:: Dellie BurnsScarlett Murray, RN BSN Blood  pressure 113/70, pulse 76, temperature 98 F (36.7 C), temperature source Oral, resp. rate 18, height 5\' 4"  (1.626 m), weight 92.534 kg (204 lb), last menstrual period 07/04/2013. Exam Physical Exam  Prenatal labs: ABO, Rh:  B negative Antibody:  Negative Rubella:  Immune RPR:   Immune HBsAg:   Negative HIV:   NR GBS: Positive (07/24 0000)   Assessment/Plan: 1) Admit 2) Hep C +, no FSE 3) Suboxone therapy, no nubain or stadol. Epidural only for pain control 4) GBS positive, PCN   Kara [name]. 04/03/2014, 11:42 AM

## 2014-04-03 NOTE — Anesthesia Preprocedure Evaluation (Signed)
Anesthesia Evaluation  Patient identified by MRN, date of birth, ID band Patient awake    Reviewed: Allergy & Precautions, H&P , Patient's Chart, lab work & pertinent test results  Airway Mallampati: III TM Distance: >3 FB Neck ROM: Full    Dental no notable dental hx. (+) Teeth Intact   Pulmonary Current Smoker,  breath sounds clear to auscultation  Pulmonary exam normal       Cardiovascular negative cardio ROS  Rhythm:Regular Rate:Normal     Neuro/Psych Depression negative neurological ROS     GI/Hepatic negative GI ROS, (+)     substance abuse  marijuana use and IV drug use, Hepatitis -, CHepatitis C with high viral load. LFT's normal Coagulation studies normal Last Heroin use 5 years ago- was on methadone maintenance now only on suboxone maintenance   Endo/Other  Obesity  Renal/GU negative Renal ROS  negative genitourinary   Musculoskeletal negative musculoskeletal ROS (+)   Abdominal (+) + obese,   Peds  Hematology negative hematology ROS (+)   Anesthesia Other Findings   Reproductive/Obstetrics (+) Pregnancy                           Anesthesia Physical Anesthesia Plan  ASA: II  Anesthesia Plan: Epidural   Post-op Pain Management:    Induction:   Airway Management Planned: Natural Airway  Additional Equipment:   Intra-op Plan:   Post-operative Plan:   Informed Consent: I have reviewed the patients History and Physical, chart, labs and discussed the procedure including the risks, benefits and alternatives for the proposed anesthesia with the patient or authorized representative who has indicated his/her understanding and acceptance.     Plan Discussed with: Anesthesiologist  Anesthesia Plan Comments:         Anesthesia Quick Evaluation

## 2014-04-03 NOTE — MAU Note (Signed)
Patient presents with complaint that her water broke this morning at 0245.

## 2014-04-03 NOTE — Anesthesia Procedure Notes (Signed)
Epidural Patient location during procedure: OB Start time: 04/03/2014 2:15 PM  Staffing Anesthesiologist: Kaylei Frink A. Performed by: anesthesiologist   Preanesthetic Checklist Completed: patient identified, site marked, surgical consent, pre-op evaluation, timeout performed, IV checked, risks and benefits discussed and monitors and equipment checked  Epidural Patient position: sitting Prep: site prepped and draped and DuraPrep Patient monitoring: continuous pulse ox and blood pressure Approach: midline Location: L3-L4 Injection technique: LOR air  Needle:  Needle type: Tuohy  Needle gauge: 17 G Needle length: 9 cm and 9 Needle insertion depth: 6 cm Catheter type: closed end flexible Catheter size: 19 Gauge Catheter at skin depth: 11 cm Test dose: negative and Other  Assessment Events: blood not aspirated, injection not painful, no injection resistance, negative IV test and no paresthesia  Additional Notes Patient identified. Risks and benefits discussed including failed block, incomplete  Pain control, post dural puncture headache, nerve damage, paralysis, blood pressure Changes, nausea, vomiting, reactions to medications-both toxic and allergic and post Partum back pain. All questions were answered. Patient expressed understanding and wished to proceed. Sterile technique was used throughout procedure. Epidural site was Dressed with sterile barrier dressing. No paresthesias, signs of intravascular injection Or signs of intrathecal spread were encountered.  Patient was more comfortable after the epidural was dosed. Please see RN's note for documentation of vital signs and FHR which are stable.

## 2014-04-04 ENCOUNTER — Encounter (HOSPITAL_COMMUNITY): Payer: Self-pay | Admitting: General Practice

## 2014-04-04 MED ORDER — ONDANSETRON HCL 4 MG PO TABS: 4.0000 mg | ORAL_TABLET | ORAL | Status: DC | PRN

## 2014-04-04 MED ORDER — DIBUCAINE 1 % RE OINT
1.0000 "application " | TOPICAL_OINTMENT | RECTAL | Status: DC | PRN
  Filled 2014-04-04: qty 28

## 2014-04-04 MED ORDER — METHYLERGONOVINE MALEATE 0.2 MG/ML IJ SOLN: 0.2000 mg | INTRAMUSCULAR | Status: DC | PRN

## 2014-04-04 MED ORDER — SERTRALINE HCL 50 MG PO TABS
50.0000 mg | ORAL_TABLET | Freq: Every day | ORAL | Status: DC
  Filled 2014-04-04 (×4): qty 1

## 2014-04-04 MED ORDER — SENNOSIDES-DOCUSATE SODIUM 8.6-50 MG PO TABS
2.0000 | ORAL_TABLET | ORAL | Status: DC
  Administered 2014-04-04 – 2014-04-05 (×2): 2 via ORAL
  Filled 2014-04-04 (×2): qty 2

## 2014-04-04 MED ORDER — BUPRENORPHINE HCL-NALOXONE HCL 8-2 MG SL SUBL
1.0000 | SUBLINGUAL_TABLET | Freq: Three times a day (TID) | SUBLINGUAL | Status: DC
  Filled 2014-04-04: qty 2

## 2014-04-04 MED ORDER — PNEUMOCOCCAL VAC POLYVALENT 25 MCG/0.5ML IJ INJ
0.5000 mL | INJECTION | INTRAMUSCULAR | Status: DC
  Filled 2014-04-04: qty 0.5

## 2014-04-04 MED ORDER — ONDANSETRON HCL 4 MG/2ML IJ SOLN: 4.0000 mg | INTRAMUSCULAR | Status: DC | PRN

## 2014-04-04 MED ORDER — METHYLERGONOVINE MALEATE 0.2 MG PO TABS: 0.2000 mg | ORAL_TABLET | ORAL | Status: DC | PRN

## 2014-04-04 MED ORDER — LANOLIN HYDROUS EX OINT
TOPICAL_OINTMENT | CUTANEOUS | Status: DC | PRN
Start: 2014-04-04 — End: 2014-04-06

## 2014-04-04 MED ORDER — BENZOCAINE-MENTHOL 20-0.5 % EX AERO
1.0000 "application " | INHALATION_SPRAY | CUTANEOUS | Status: DC | PRN
  Filled 2014-04-04 (×2): qty 56

## 2014-04-04 MED ORDER — TETANUS-DIPHTH-ACELL PERTUSSIS 5-2.5-18.5 LF-MCG/0.5 IM SUSP: 0.5000 mL | Freq: Once | INTRAMUSCULAR | Status: DC

## 2014-04-04 MED ORDER — PRENATAL MULTIVITAMIN CH
1.0000 | ORAL_TABLET | Freq: Every day | ORAL | Status: DC
  Administered 2014-04-04 – 2014-04-06 (×3): 1 via ORAL
  Filled 2014-04-04 (×3): qty 1

## 2014-04-04 MED ORDER — SIMETHICONE 80 MG PO CHEW: 80.0000 mg | CHEWABLE_TABLET | ORAL | Status: DC | PRN

## 2014-04-04 MED ORDER — DIPHENHYDRAMINE HCL 25 MG PO CAPS: 25.0000 mg | ORAL_CAPSULE | Freq: Four times a day (QID) | ORAL | Status: DC | PRN

## 2014-04-04 MED ORDER — ZOLPIDEM TARTRATE 5 MG PO TABS: 5.0000 mg | ORAL_TABLET | Freq: Every evening | ORAL | Status: DC | PRN

## 2014-04-04 MED ORDER — OXYCODONE-ACETAMINOPHEN 5-325 MG PO TABS: 1.0000 | ORAL_TABLET | ORAL | Status: DC | PRN

## 2014-04-04 MED ORDER — WITCH HAZEL-GLYCERIN EX PADS: 1.0000 "application " | MEDICATED_PAD | CUTANEOUS | Status: DC | PRN

## 2014-04-04 MED ORDER — IBUPROFEN 600 MG PO TABS
600.0000 mg | ORAL_TABLET | Freq: Four times a day (QID) | ORAL | Status: DC
  Administered 2014-04-04 – 2014-04-06 (×9): 600 mg via ORAL
  Filled 2014-04-04 (×9): qty 1

## 2014-04-04 MED ORDER — BUPRENORPHINE HCL 8 MG SL SUBL
8.0000 mg | SUBLINGUAL_TABLET | Freq: Three times a day (TID) | SUBLINGUAL | Status: DC
  Administered 2014-04-04 – 2014-04-06 (×7): 8 mg via SUBLINGUAL
  Filled 2014-04-04 (×7): qty 1

## 2014-04-04 NOTE — Progress Notes (Signed)
Ur chart review completed.  

## 2014-04-04 NOTE — Progress Notes (Signed)
Clinical Social Work Department PSYCHOSOCIAL ASSESSMENT - MATERNAL/CHILD 04/04/2014  Patient:  Kara [name], Kara [name]  Account Number:  [id number]  Admit Date:  04/03/2014  Ardine Eng Name:   Avel Sensor    Clinical Social Worker:  Lucita Ferrara, CLINICAL SOCIAL WORKER   Date/Time:  04/04/2014 02:00 PM  Date Referred:  04/04/2014   Referral source  CN     Referred reason  [place]  Substance Abuse   Other referral source:    I:  FAMILY / HOME ENVIRONMENT Child's legal guardian:  PARENT  Guardian - Name Guardian - Age Guardian - Address  Marica Trentham [address] [place], [place] [address]  Alvira Philips  same as above   Other household support members/support persons Name Relationship DOB  Avianna Tamala Julian DAUGHTER 36 years old  [place] 48 years old   Other support:   MOB shared that she and the FOB live with her parents.  She shared that they are supportive.    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   Financial resources:  Medicaid If Medicaid - County:  [place] / Grade:  N/A Music therapist / Child Services Coordination / Early Interventions:   N/A  Cultural issues impacting care:   None reported    III  STRENGTHS Strengths  Home prepared for Child (including basic supplies)  Supportive family/friends   Strength comment:    IV  RISK FACTORS AND CURRENT PROBLEMS Current Problem:  YES   Risk Factor & Current Problem Patient Issue Family Issue Risk Factor / Current Problem Comment  Substance Abuse Y N MOB's toxicology screen positive for THC on 04/03/14.  Other - See comment Y N MOB attended first prenatal appointment at 47 weeks.    V  SOCIAL WORK ASSESSMENT CSW met with MOB in her room in order to complete the assessment.  Consult  ordered due to [facility] as MOB presented for first prenatal visit at 32 weeks.  Consult also requested due to MOB having positive toxicology screen for [facility] upon admission to [facility] on 8/9.  MOB was minimally receptive to assessment, intervention, and support AEB being closed, guarded, and responding with short/concise answers.  FOB was observed to be in the corner of the room sleeping, and MOB provided consent for CSW to complete assessment in his presence   MOB minimally engaged with CSW as CSW inquired about thoughts and feelings during the prenatal period and now as she enters the post-partum period.  She shared that she lives with her parents and they are helping care for her other 2 children.  MOB discussed that a friend will be bringing in a car seat and that she has identified a pediatrician.  MOB discussed that she will be staying at home to care for her children as she is unemployed, but she did not elaborate on her feelings related to her role as a mother and not being able to secure employment.    CSW discussed reason for consult.  MOB acknowledged late prenatal care, but she refused to further elaborate on reasons for no prenatal care.  She shared that "there was a lot going on", and when CSW attempted to explore these stressors, she said, "it was not just one thing, it was a lot".  CSW  inquired about any barriers to appointments.  MOB declined barriers and shared that she has access to transportation.  CSW reviewed the importance of attending follow-up appointments, and shared that she will ensure that they attend all appointments.   CSW provided education on drug screen policy.  MOB verbalized understanding, but did not express any concerns about potential CPS involvement.  CSW also did not note any change in affect when CSW shared this information.  MOB admits that CPS has involved after giving birth to each of her children, but stated that it was uneventful since the cases were closed  quickly.    CSW also inquired about refusal of Zoloft this morning. Per MOB, this was offered to her in error.  MOB shared that she had been taking Xanax, but had not taken it "in awhile".  When CSW attempted to clarify prior mental health history and reason for medications, MOB was closed, guarded, and denied mental health history.   MOB denied previous symptoms of post-partum depression, but did maintain eye contact as CSW discussed signs/symptoms.  MOB is agreeable to contacting MD if symptoms occur.    MOB aware of CSW availability.    VI SOCIAL WORK PLAN Social Work Therapist, art  No Further Intervention Required / No Barriers to Discharge   Type of pt/family education:   Post-partum depression and anxiety  Drug screen policy   If child protective services report - county:   If child protective services report - date:   Information/referral to community resources comment:   Other social work plan:   CSW to monitor toxicology reports of baby and will make CPS report if toxicology screens are positive.

## 2014-04-04 NOTE — Progress Notes (Signed)
Patient declines zoloft. States at beginning of pregnancy was taking Xanax but is completely off it now and does not need it. Will continue to monitor. Earl Galasborne, Linda HedgesStefanie QuailHudspeth

## 2014-04-04 NOTE — Anesthesia Postprocedure Evaluation (Signed)
  Anesthesia Post-op Note  Patient: Kara [name]  Procedure(s) Performed: * No procedures listed *  Patient Location: Mother/Baby  Anesthesia Type:Epidural  Level of Consciousness: awake  Airway and Oxygen Therapy: Patient Spontanous Breathing  Post-op Pain: mild  Post-op Assessment: Patient's Cardiovascular Status Stable and Respiratory Function Stable  Post-op Vital Signs: stable  Last Vitals:  Filed Vitals:   04/04/14 1230  BP: 108/54  Pulse: 61  Temp: 36.7 C  Resp: 16    Complications: No apparent anesthesia complications

## 2014-04-05 LAB — CBC
HEMATOCRIT: 30.7 % — AB (ref 36.0–46.0)
HEMOGLOBIN: 10.5 g/dL — AB (ref 12.0–15.0)
MCH: 31.7 pg (ref 26.0–34.0)
MCHC: 34.2 g/dL (ref 30.0–36.0)
MCV: 92.7 fL (ref 78.0–100.0)
Platelets: 136 10*3/uL — ABNORMAL LOW (ref 150–400)
RBC: 3.31 MIL/uL — AB (ref 3.87–5.11)
RDW: 12.7 % (ref 11.5–15.5)
WBC: 10.3 10*3/uL (ref 4.0–10.5)

## 2014-04-05 MED ORDER — RHO D IMMUNE GLOBULIN 1500 UNIT/2ML IJ SOSY
300.0000 ug | PREFILLED_SYRINGE | Freq: Once | INTRAMUSCULAR | Status: AC
  Administered 2014-04-05: 300 ug via INTRAMUSCULAR
  Filled 2014-04-05: qty 2

## 2014-04-05 NOTE — Progress Notes (Signed)
Patient is eating, ambulating, voiding.  Pain control is good.  Filed Vitals:   04/04/14 0900 04/04/14 1230 04/04/14 1735 04/05/14 0610  BP: 110/56 108/54 115/71 98/55  Pulse: 67 61 90 52  Temp: 98.2 F (36.8 C) 98 F (36.7 C) 98.6 F (37 C) 97.9 F (36.6 C)  TempSrc: Oral Oral Oral Oral  Resp: 18 16 18 16   Height:      Weight:      SpO2:        Fundus firm Perineum without swelling.  Lab Results  Component Value Date   WBC 10.3 04/05/2014   HGB 10.5* 04/05/2014   HCT 30.7* 04/05/2014   MCV 92.7 04/05/2014   PLT 136* 04/05/2014    --/--/B NEG (08/09 1210)/RI  A/P Post partum day 1.  Routine care.  Expect d/c routine.  LFTs normal.  Rhogam given- bay Rh+.  THC urine +.  Social work involved.  Will need f/u with GI pp for high viral load of HCV.  Micah Galeno A

## 2014-04-06 LAB — RH IG WORKUP (INCLUDES ABO/RH)
ABO/RH(D): B NEG
FETAL SCREEN: NEGATIVE
GESTATIONAL AGE(WKS): 39.1
Unit division: 0

## 2014-04-06 NOTE — Discharge Summary (Signed)
Obstetric Discharge Summary Reason for Admission: rupture of membranes Prenatal Procedures: ultrasound Intrapartum Procedures: spontaneous vaginal delivery Postpartum Procedures: none Complications-Operative and Postpartum: none Hemoglobin  Date Value Ref Range Status  04/05/2014 10.5* 12.0 - 15.0 g/dL Final     HCT  Date Value Ref Range Status  04/05/2014 30.7* 36.0 - 46.0 % Final    Physical Exam:  General: alert and cooperative Lochia: appropriate Uterine Fundus: firm DVT Evaluation: No evidence of DVT seen on physical exam.  Discharge Diagnoses: Term Pregnancy-delivered  Discharge Information: Date: 04/06/2014 Activity: pelvic rest Diet: routine Medications: PNV and Ibuprofen Condition: stable Instructions: refer to practice specific booklet Discharge to: home Follow-up Information   Follow up with Almon HerculesOSS,KENDRA H., MD In 4 weeks.   Specialty:  Obstetrics and Gynecology   Contact information:   [address] [place] [id number] [phone]       Newborn Data: Live born female  Birth Weight: 7 lb 7.1 oz (3375 g) APGAR: 9, 9  Baby remaining for observation at time of mother's discharge.  Philip AspenCALLAHAN, [place] Nunley 04/06/2014, 2:23 PM

## 2014-06-27 ENCOUNTER — Encounter (HOSPITAL_COMMUNITY): Payer: Self-pay | Admitting: General Practice

## 2015-03-27 ENCOUNTER — Emergency Department (HOSPITAL_BASED_OUTPATIENT_CLINIC_OR_DEPARTMENT_OTHER)
Admission: EM | Admit: 2015-03-27 | Discharge: 2015-03-27 | Disposition: A | Discharge status: Home / Self Care | Payer: Medicaid Other | Attending: Emergency Medicine | Admitting: Emergency Medicine

## 2015-03-27 ENCOUNTER — Encounter (HOSPITAL_BASED_OUTPATIENT_CLINIC_OR_DEPARTMENT_OTHER): Payer: Self-pay | Admitting: *Deleted

## 2015-03-27 DIAGNOSIS — L02211 Cutaneous abscess of abdominal wall: Secondary | ICD-10-CM | POA: Insufficient documentation

## 2015-03-27 DIAGNOSIS — Z79899 Other long term (current) drug therapy: Secondary | ICD-10-CM | POA: Insufficient documentation

## 2015-03-27 DIAGNOSIS — Z720 Tobacco use: Secondary | ICD-10-CM | POA: Diagnosis not present

## 2015-03-27 DIAGNOSIS — Z8619 Personal history of other infectious and parasitic diseases: Secondary | ICD-10-CM | POA: Insufficient documentation

## 2015-03-27 MED ORDER — SULFAMETHOXAZOLE-TRIMETHOPRIM 800-160 MG PO TABS: 1.0000 | ORAL_TABLET | Freq: Two times a day (BID) | ORAL | Status: AC

## 2015-03-27 MED ORDER — LIDOCAINE-EPINEPHRINE (PF) 2 %-1:200000 IJ SOLN
10.0000 mL | Freq: Once | INTRAMUSCULAR | Status: AC
  Administered 2015-03-27: 10 mL via INTRADERMAL
  Filled 2015-03-27: qty 10

## 2015-03-27 MED ORDER — CEPHALEXIN 500 MG PO CAPS: 500.0000 mg | ORAL_CAPSULE | Freq: Four times a day (QID) | ORAL | Status: DC

## 2015-03-27 MED ORDER — TRAMADOL HCL 50 MG PO TABS: 50.0000 mg | ORAL_TABLET | Freq: Four times a day (QID) | ORAL | Status: DC | PRN

## 2015-03-27 NOTE — Discharge Instructions (Signed)
Take both antibiotics as prescribed until all gone. Tramadol for pain only as needed. Warm soaks or compresses. Follow up in 2 days to get packing removed or if abscess is feeling better, remove packing yourself. Follow-up as needed.   Abscess Care After An abscess (also called a boil or furuncle) is an infected area that contains a collection of pus. Signs and symptoms of an abscess include pain, tenderness, redness, or hardness, or you may feel a moveable soft area under your skin. An abscess can occur anywhere in the body. The infection may spread to surrounding tissues causing cellulitis. A cut (incision) by the surgeon was made over your abscess and the pus was drained out. Gauze may have been packed into the space to provide a drain that will allow the cavity to heal from the inside outwards. The boil may be painful for 5 to 7 days. Most people with a boil do not have high fevers. Your abscess, if seen early, may not have localized, and may not have been lanced. If not, another appointment may be required for this if it does not get better on its own or with medications. HOME CARE INSTRUCTIONS   Only take over-the-counter or prescription medicines for pain, discomfort, or fever as directed by your caregiver.  When you bathe, soak and then remove gauze or iodoform packs at least daily or as directed by your caregiver. You may then wash the wound gently with mild soapy water. Repack with gauze or do as your caregiver directs. SEEK IMMEDIATE MEDICAL CARE IF:   You develop increased pain, swelling, redness, drainage, or bleeding in the wound site.  You develop signs of generalized infection including muscle aches, chills, fever, or a general ill feeling.  An oral temperature above 102 F (38.9 C) develops, not controlled by medication. See your caregiver for a recheck if you develop any of the symptoms described above. If medications (antibiotics) were prescribed, take them as  directed. Document Released: 02/28/2005 Document Revised: 11/04/2011 Document Reviewed: 10/26/2007 [facility] Patient Information 2015 [place], [place]. This information is not intended to replace advice given to you by your health care provider. Make sure you discuss any questions you have with your health care provider.

## 2015-03-27 NOTE — ED Notes (Signed)
Abscess that is open located on her right hip x 2 days.

## 2015-03-27 NOTE — ED Provider Notes (Signed)
CSN: [id number]     Arrival date & time 03/27/15  1710 History   First MD Initiated Contact with Patient 03/27/15 1732     Chief Complaint  Patient presents with  . Abscess     (Consider location/radiation/quality/duration/timing/severity/associated sxs/prior Treatment) HPI Kara [name] is a 29 y.o. female with hx of hep c, presents to ED with complaint of right lower abdominal wall abscess. States it started 3 days ago. States "it is getting larger and more painful." states she soaked in Epsom salt and try to squeeze it but states nothing comes out. She denies any fever or chills. No associated symptoms.  Reports 1 prior abscess to the left arm, states her sister and her dad recently had an abscess as well. She denies any injuries to the area. She denies any cuts or scrapes. She denies any insect bites. She has not tried any other treatment on the area.  Past Medical History  Diagnosis Date  . Hepatitis C     Dx 2009  . Infection   . STD (female)   . Preterm labor   . Vaginal Pap smear, abnormal     cryo therapy 2008   Past Surgical History  Procedure Laterality Date  . Extraction of wisdome teeth      age 24   Family History  Problem Relation Age of Onset  . Adopted: Yes   History  Substance Use Topics  . Smoking status: Current Some Day Smoker -- 0.50 packs/day for 4 years    Types: Cigarettes  . Smokeless tobacco: Never Used  . Alcohol Use: No   OB History    Gravida Para Term Preterm AB TAB SAB Ectopic Multiple Living   5 3 2 1 2 2  0 0 0 3     Review of Systems  Constitutional: Negative for fever and chills.  Respiratory: Negative for cough, chest tightness and shortness of breath.   Cardiovascular: Negative for chest pain, palpitations and leg swelling.  Musculoskeletal: Negative for myalgias, arthralgias, neck pain and neck stiffness.  Skin: Positive for color change and wound. Negative for rash.  Neurological: Negative for dizziness, weakness and  headaches.  All other systems reviewed and are negative.     Allergies  Review of patient's allergies indicates no known allergies.  Home Medications   Prior to Admission medications   Medication Sig Start Date End Date Taking? Authorizing Provider  Buprenorphine HCl-Naloxone HCl (SUBOXONE) 8-2 MG FILM Place 1-2 Film under the tongue 3 (three) times daily. Patient states she takes 1 to 2 filmstrip SL three times daily (morning, lunch, and dinner).  She decides on dose based on symptoms.    Historical Provider, MD  Prenat w/o A Vit-FeFum-FePo-FA (CONCEPT OB) 130-92.4-1 MG CAPS Take 1 tablet by mouth daily. 09/27/11   Dorathy Kinsman, CNM  sertraline (ZOLOFT) 50 MG tablet Take 1 tablet (50 mg total) by mouth daily. Take 1/2 tablet daily x 1 week, then 1 tablet daily 09/27/11 09/26/12  Dorathy Kinsman, CNM   BP 118/64 mmHg  Pulse 80  Temp(Src) 98.6 F (37 C) (Oral)  Resp 18  Wt 200 lb (90.719 kg)  SpO2 99%  LMP 03/20/2015 Physical Exam  Constitutional: She appears well-developed and well-nourished. No distress.  Eyes: Conjunctivae are normal.  Neck: Neck supple.  Cardiovascular: Normal rate, regular rhythm and normal heart sounds.   Pulmonary/Chest: Effort normal and breath sounds normal. No respiratory distress.  Neurological: She is alert.  Skin: Skin is warm and dry.  3 x 3 cm abscess to the right lower abdominal wall, with cenral skin ulceration with purulent drainage. Tender to palpation. Mild surrounding cellulitis.   Nursing note and vitals reviewed.   ED Course  Procedures (including critical care time) Labs Review Labs Reviewed - No data to display  Imaging Review No results found.   EKG Interpretation None       INCISION AND DRAINAGE Performed by: Jaynie Crumble A Consent: Verbal consent obtained. Risks and benefits: risks, benefits and alternatives were discussed Type: abscess  Body area: right lower abdominal wall  Anesthesia: local  infiltration  Incision was made with a scalpel.  Local anesthetic: lidocaine 2% w epinephrine  Anesthetic total: 4 ml  Complexity: complex Blunt dissection to break up loculations  Drainage: purulent  Drainage amount: large  Packing material: 1/4 in iodoform gauze  Patient tolerance: Patient tolerated the procedure well with no immediate complications.     MDM   Final diagnoses:  Abscess of abdominal wall    Patient with right lower abdominal wall abscess with mild cellulitis. She has history of hepatitis C, otherwise no medical problems. Her vital signs here are normal. She is afebrile. She is nontoxic appearing. No evidence of sepsis. Abscess was incised and drained. Plan to discharge home with oral antibiotics, pain medications as needed. She is instructed to follow-up in 2 days with her doctor or return here if it is not improving, otherwise warm soaks, packing removal in 2 days, taken antibiotics until all gone.    Filed Vitals:   03/27/15 1717 03/27/15 1835  BP: 118/64 108/60  Pulse: 80 58  Temp: 98.6 F (37 C)   TempSrc: Oral   Resp: 18 18  Weight: 200 lb (90.719 kg)   SpO2: 99% 98%     Jaynie Crumble, PA-C 03/28/15 0022  Jerelyn Scott, MD 03/28/15 [phone]

## 2015-03-28 NOTE — Progress Notes (Signed)
[facility] received phone call from Koleen Nimrod from [facility] pharmacy [phone] regarding confirming Tramadol prescription.  Per chart review patient was discharged from [facility] with rx for tramadol 50 mg take every six hours as needed for pain 15 tablets ordered.  EDCM notified Koleen Nimrod at [place] of above.  No further EDCM needs at this time.

## 2015-03-31 ENCOUNTER — Emergency Department (HOSPITAL_BASED_OUTPATIENT_CLINIC_OR_DEPARTMENT_OTHER)
Admission: EM | Admit: 2015-03-31 | Discharge: 2015-03-31 | Disposition: A | Discharge status: Home / Self Care | Payer: Medicaid Other | Attending: Emergency Medicine | Admitting: Emergency Medicine

## 2015-03-31 ENCOUNTER — Encounter (HOSPITAL_BASED_OUTPATIENT_CLINIC_OR_DEPARTMENT_OTHER): Payer: Self-pay | Admitting: Emergency Medicine

## 2015-03-31 DIAGNOSIS — Z8751 Personal history of pre-term labor: Secondary | ICD-10-CM | POA: Diagnosis not present

## 2015-03-31 DIAGNOSIS — Z4801 Encounter for change or removal of surgical wound dressing: Secondary | ICD-10-CM | POA: Insufficient documentation

## 2015-03-31 DIAGNOSIS — Z8619 Personal history of other infectious and parasitic diseases: Secondary | ICD-10-CM | POA: Diagnosis not present

## 2015-03-31 DIAGNOSIS — Z720 Tobacco use: Secondary | ICD-10-CM | POA: Insufficient documentation

## 2015-03-31 DIAGNOSIS — Z5189 Encounter for other specified aftercare: Secondary | ICD-10-CM

## 2015-03-31 NOTE — ED Notes (Signed)
Pt had I&D here 4 days ago and has packing in the wound. Sts she cannot pull it out and wants it rechecked.

## 2015-03-31 NOTE — ED Notes (Signed)
MD at bedside. 

## 2015-03-31 NOTE — ED Provider Notes (Addendum)
CSN: [id number]     Arrival date & time 03/31/15  1522 History   First MD Initiated Contact with Patient 03/31/15 1703     Chief Complaint  Patient presents with  . Wound Check     (Consider location/radiation/quality/duration/timing/severity/associated sxs/prior Treatment) Patient is a 29 y.o. female presenting with wound check.  Wound Check This is a new problem. The current episode started more than 2 days ago. The problem occurs constantly. The problem has been gradually improving. Pertinent negatives include no chest pain, no abdominal pain, no headaches and no shortness of breath. Nothing aggravates the symptoms. Nothing relieves the symptoms. Treatments tried: soaking, began abx tues. The treatment provided moderate relief.    Past Medical History  Diagnosis Date  . Hepatitis C     Dx 2009  . Infection   . STD (female)   . Preterm labor   . Vaginal Pap smear, abnormal     cryo therapy 2008   Past Surgical History  Procedure Laterality Date  . Extraction of wisdome teeth      age 68   Family History  Problem Relation Age of Onset  . Adopted: Yes   History  Substance Use Topics  . Smoking status: Current Some Day Smoker -- 0.50 packs/day for 4 years    Types: Cigarettes  . Smokeless tobacco: Never Used  . Alcohol Use: No   OB History    Gravida Para Term Preterm AB TAB SAB Ectopic Multiple Living   0 0 0 3     Review of Systems  Constitutional: Negative for fever.  HENT: Negative for sore throat.   Eyes: Negative for visual disturbance.  Respiratory: Negative for cough and shortness of breath.   Cardiovascular: Negative for chest pain.  Gastrointestinal: Negative for nausea, vomiting and abdominal pain.  Genitourinary: Negative for difficulty urinating.  Musculoskeletal: Negative for back pain and neck pain.  Skin: Positive for wound. Negative for rash.  Neurological: Negative for syncope and headaches.      Allergies  Review of patient's  allergies indicates no known allergies.  Home Medications   Prior to Admission medications   Medication Sig Start Date End Date Taking? Authorizing Provider  Buprenorphine HCl-Naloxone HCl (SUBOXONE) 8-2 MG FILM Place 1-2 Film under the tongue 3 (three) times daily. Patient states she takes 1 to 2 filmstrip SL three times daily (morning, lunch, and dinner).  She decides on dose based on symptoms.    Historical Provider, MD  cephALEXin (KEFLEX) 500 MG capsule Take 1 capsule (500 mg total) by mouth 4 (four) times daily. 03/27/15   Tatyana Kirichenko, PA-C  Prenat w/o A Vit-FeFum-FePo-FA (CONCEPT OB) 130-92.4-1 MG CAPS Take 1 tablet by mouth daily. 09/27/11   Dorathy Kinsman, CNM  sertraline (ZOLOFT) 50 MG tablet Take 1 tablet (50 mg total) by mouth daily. Take 1/2 tablet daily x 1 week, then 1 tablet daily 09/27/11 09/26/12  Dorathy Kinsman, CNM  sulfamethoxazole-trimethoprim (BACTRIM DS,SEPTRA DS) 800-160 MG per tablet Take 1 tablet by mouth 2 (two) times daily. 03/27/15 04/03/15  Tatyana Kirichenko, PA-C  traMADol (ULTRAM) 50 MG tablet Take 1 tablet (50 mg total) by mouth every 6 (six) hours as needed. 03/27/15   Tatyana Kirichenko, PA-C   BP 124/66 mmHg  Pulse 64  Temp(Src) 98.3 F (36.8 C) (Oral)  Resp 16  Ht  (1.6 m)  Wt 200 lb (90.719 kg)  BMI 35.44 kg/m2  SpO2 100%  LMP 03/20/2015 Physical Exam  Constitutional: She is oriented to person, place, and time. She appears well-developed and well-nourished. No distress.  HENT:  Head: Normocephalic and atraumatic.  Eyes: Conjunctivae and EOM are normal.  Neck: Normal range of motion.  Cardiovascular: Normal rate, regular rhythm, normal heart sounds and intact distal pulses.  Exam reveals no gallop and no friction rub.   No murmur heard. Pulmonary/Chest: Effort normal and breath sounds normal. No respiratory distress. She has no wheezes. She has no rales.  Abdominal: Soft. She exhibits no distension. There is no tenderness. There is no guarding.   Musculoskeletal: She exhibits no edema or tenderness.  Neurological: She is alert and oriented to person, place, and time.  Skin: Skin is warm and dry. No rash noted. She is not diaphoretic. No erythema.  3cm x1.5cm wound open, erythema only of border,   Nursing note and vitals reviewed.   ED Course  Procedures (including critical care time) Labs Review Labs Reviewed - No data to display  Imaging Review No results found.   EKG Interpretation None      MDM   Final diagnoses:  Wound check, abscess   29yo female presents 4 days after incision and drainage with concern that she cannot pull packing from wound.  Packing removed from wound and area irrigated and dressed.  Signs of healing, no signs of surrounding cellulitis. Patient discharged in stable condition with understanding of reasons to return and instructed to continue dressing changes with nonadhesive bacterial dressing.     Alvira Monday, MD 04/01/15 2308

## 2015-04-27 IMAGING — US US OB DETAIL+14 WK
1 series · 12 of 28 positions shown · non-contrast
Comparison: none

[Series 1: us ob detail+14 wk · 0.28mm/px · 12 of 78 slices shown]
[im 3/78]
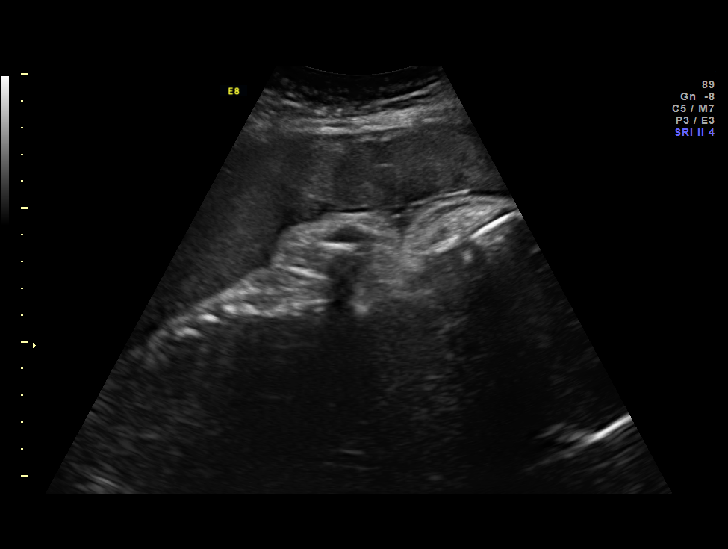
[im 9/78]
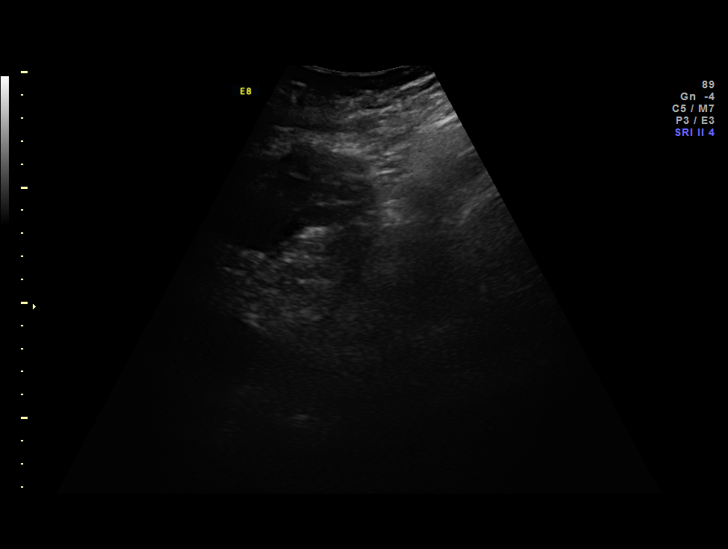
[im 15/78]
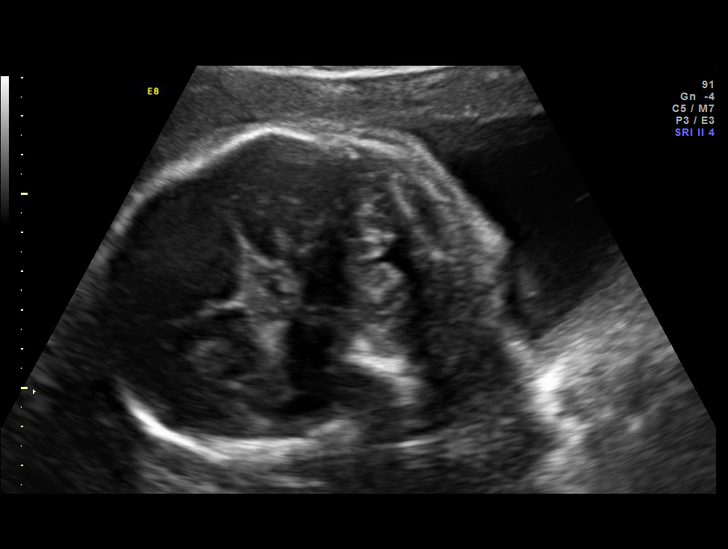
[im 23/78]
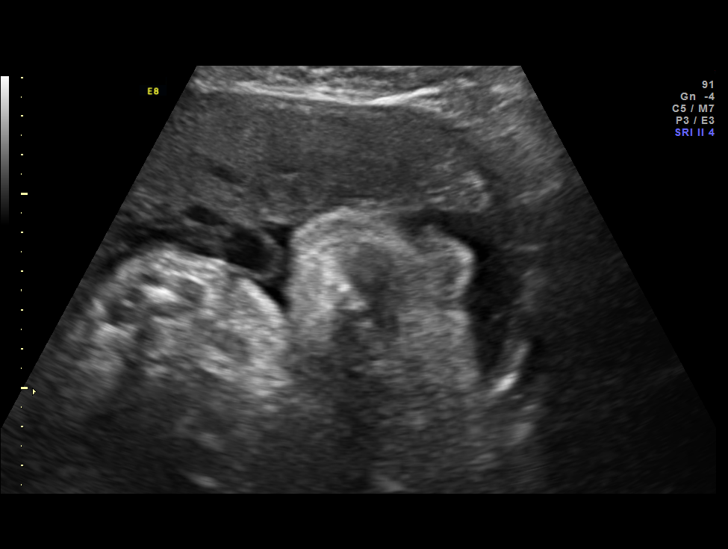
[im 29/78]
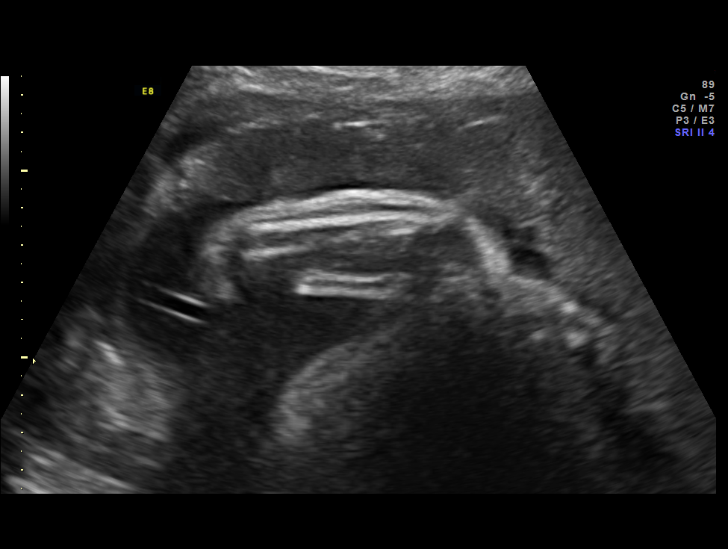
[im 35/78]
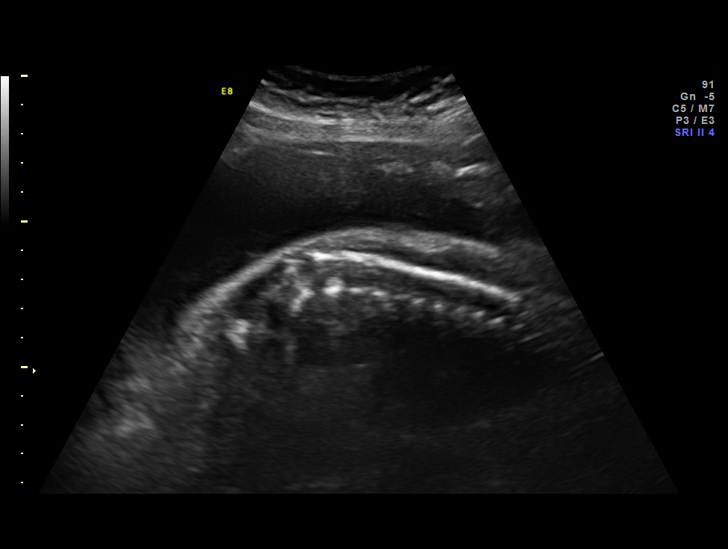
[im 43/78]
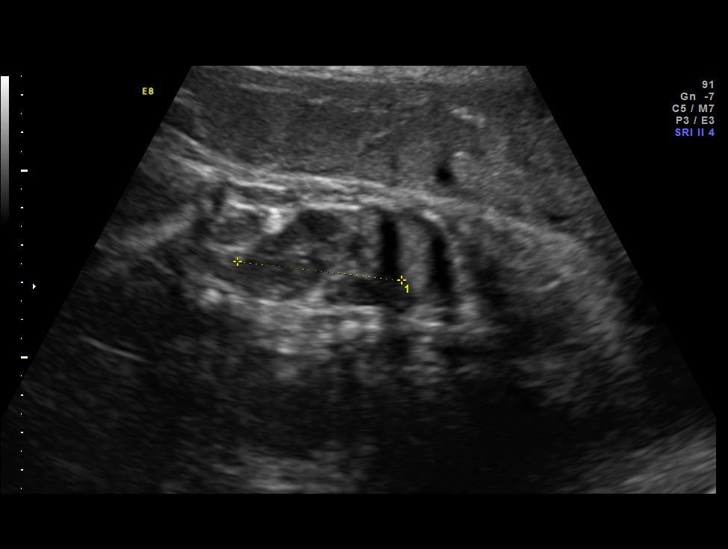
[im 49/78]
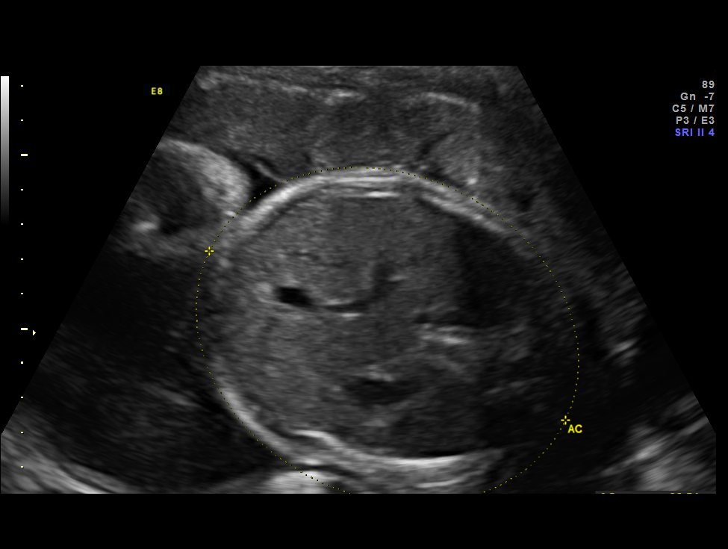
[im 55/78]
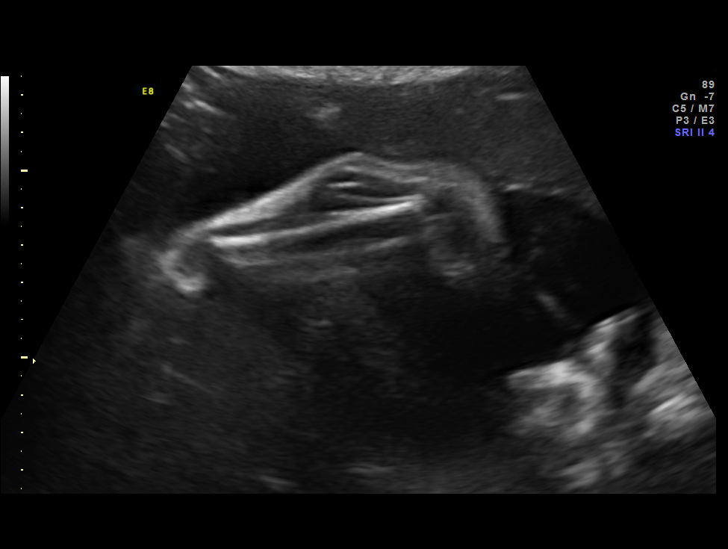
[im 63/78]
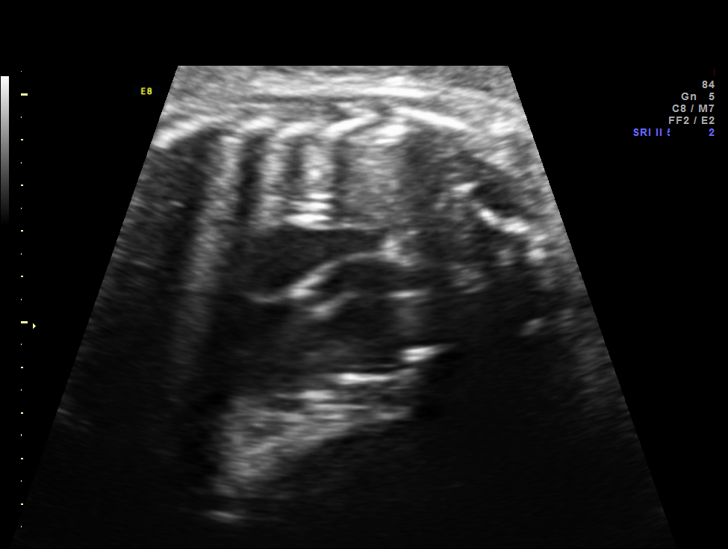
[im 69/78]
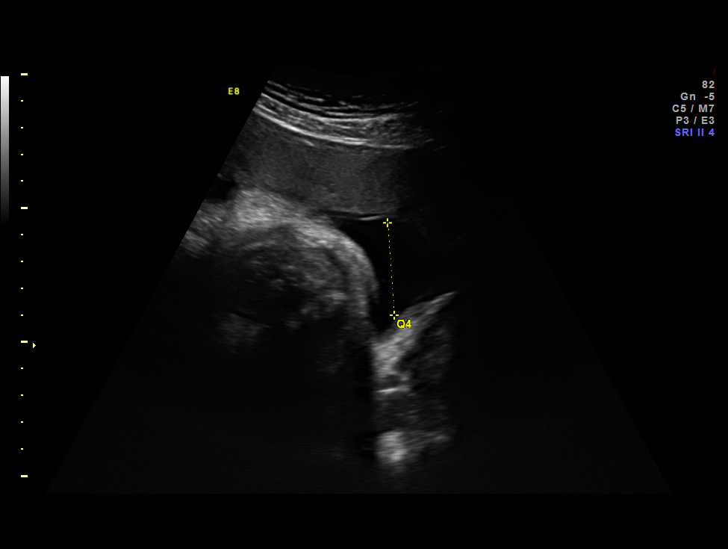
[im 75/78]
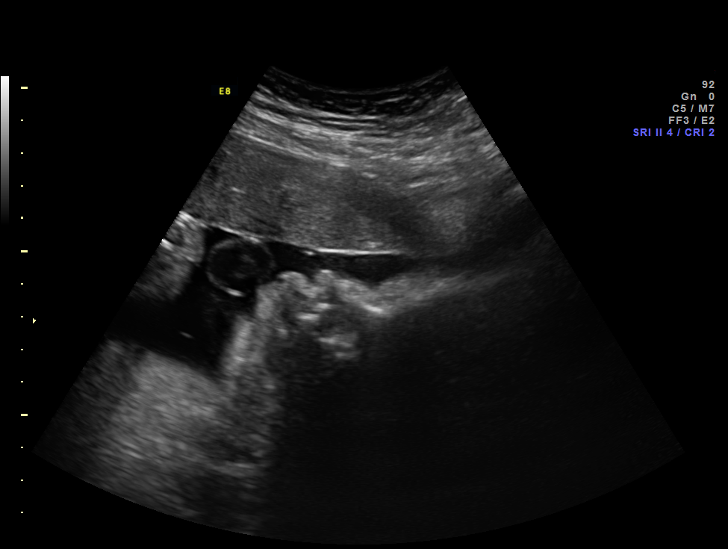

[12 of 28 positions shown; findings below may reference images not displayed]

OBSTETRICS REPORT
                      (Signed Final 03/08/2014 [DATE])

Service(s) Provided

 US OB DETAIL + 14 WK                                  76811.0
Indications

 Detailed fetal anatomic survey
 No or Little Prenatal Care
 Chronic Hepatitis C complicating pregnancy,           647.63,
 antepartum
 Suboxone
Fetal Evaluation

 Num Of Fetuses:    1
 Fetal Heart Rate:  122                          bpm
 Cardiac Activity:  Observed
 Presentation:      Cephalic
 Placenta:          Anterior, above cervical os
 P. Cord            Visualized
 Insertion:

 Amniotic Fluid
 AFI FV:      Subjectively within normal limits
 AFI Sum:     16.81   cm       62  %Tile     Larg Pckt:    6.41  cm
 RUQ:   3.4     cm   RLQ:    3.46   cm    LUQ:   6.41    cm   LLQ:    3.54   cm
Biometry

 BPD:     82.3  mm     G. Age:  33w 1d                CI:         73.2   70 - 86
 OFD:    112.4  mm                                    FL/HC:      21.3   20.1 -

 HC:     313.7  mm     G. Age:  35w 1d       17  %    HC/AC:      0.97   0.93 -

 AC:       322  mm     G. Age:  36w 1d       79  %    FL/BPD:     81.3   71 - 87
 FL:      66.9  mm     G. Age:  34w 3d       22  %    FL/AC:      20.8   20 - 24
 HUM:       59  mm     G. Age:  34w 1d       44  %

 Est. FW:    8786  gm    5 lb 13 oz      61  %
Gestational Age

 LMP:           35w 2d        Date:  07/04/13                 EDD:   04/10/14
 U/S Today:     34w 5d                                        EDD:   04/14/14
 Best:          35w 2d     Det. By:  LMP  (07/04/13)          EDD:   04/10/14
Anatomy

 Cranium:          Appears normal         Aortic Arch:      Not well visualized
 Fetal Cavum:      Not well visualized    Ductal Arch:      Not well visualized
 Ventricles:       Appears normal         Diaphragm:        Appears normal
 Choroid Plexus:   Appears normal         Stomach:          Appears normal, left
                                                            sided
 Cerebellum:       Appears normal         Abdomen:          Appears normal
 Posterior Fossa:  Appears normal         Abdominal Wall:   Appears nml (cord
                                                            insert, abd wall)
 Nuchal Fold:      Not applicable (>20    Cord Vessels:     Appears normal (3
                   wks GA)                                  vessel cord)
 Face:             Appears normal         Kidneys:          Appear normal
                   (orbits and profile)
 Lips:             Appears normal         Bladder:          Appears normal
 Heart:            Appears normal         Spine:            Appears normal
                   (4CH, axis, and
                   situs)
 RVOT:             Appears normal         Lower             Visualized
                                          Extremities:
 LVOT:             Appears normal         Upper             Visualized
                                          Extremities:

 Other:  Female gender.
Cervix Uterus Adnexa

 Cervix:       Not visualized (advanced GA >14wks)
Impression

 SIUP at 35+2 weeks
 Normal detailed fetal anatomy; limited views of arches and
 CSP
 Normal amniotic fluid volume
 Measurements consistent with LMP dating; EFW at the 61st
 %tile
Recommendations

 Follow-up as clinically indicated

## 2016-05-02 ENCOUNTER — Emergency Department (HOSPITAL_BASED_OUTPATIENT_CLINIC_OR_DEPARTMENT_OTHER): Payer: Medicaid Other

## 2016-05-02 ENCOUNTER — Encounter (HOSPITAL_BASED_OUTPATIENT_CLINIC_OR_DEPARTMENT_OTHER): Payer: Self-pay | Admitting: Emergency Medicine

## 2016-05-02 ENCOUNTER — Emergency Department (HOSPITAL_BASED_OUTPATIENT_CLINIC_OR_DEPARTMENT_OTHER)
Admission: EM | Admit: 2016-05-02 | Discharge: 2016-05-02 | Disposition: A | Discharge status: Home / Self Care | Payer: Medicaid Other | Attending: Emergency Medicine | Admitting: Emergency Medicine

## 2016-05-02 DIAGNOSIS — Z3A01 Less than 8 weeks gestation of pregnancy: Secondary | ICD-10-CM | POA: Diagnosis not present

## 2016-05-02 DIAGNOSIS — M5441 Lumbago with sciatica, right side: Secondary | ICD-10-CM | POA: Diagnosis not present

## 2016-05-02 DIAGNOSIS — O26891 Other specified pregnancy related conditions, first trimester: Secondary | ICD-10-CM | POA: Diagnosis not present

## 2016-05-02 DIAGNOSIS — F17210 Nicotine dependence, cigarettes, uncomplicated: Secondary | ICD-10-CM | POA: Diagnosis not present

## 2016-05-02 DIAGNOSIS — Z79899 Other long term (current) drug therapy: Secondary | ICD-10-CM | POA: Insufficient documentation

## 2016-05-02 DIAGNOSIS — M549 Dorsalgia, unspecified: Secondary | ICD-10-CM | POA: Diagnosis present

## 2016-05-02 DIAGNOSIS — Z3201 Encounter for pregnancy test, result positive: Secondary | ICD-10-CM

## 2016-05-02 DIAGNOSIS — R102 Pelvic and perineal pain: Secondary | ICD-10-CM | POA: Diagnosis not present

## 2016-05-02 LAB — URINALYSIS, ROUTINE W REFLEX MICROSCOPIC
Bilirubin Urine: NEGATIVE
Glucose, UA: NEGATIVE mg/dL
Hgb urine dipstick: NEGATIVE
Ketones, ur: NEGATIVE mg/dL
Leukocytes, UA: NEGATIVE
Nitrite: NEGATIVE
Protein, ur: NEGATIVE mg/dL
Specific Gravity, Urine: 1.024 (ref 1.005–1.030)
pH: 7.5 (ref 5.0–8.0)

## 2016-05-02 LAB — WET PREP, GENITAL
Clue Cells Wet Prep HPF POC: NONE SEEN
Sperm: NONE SEEN
Trich, Wet Prep: NONE SEEN
Yeast Wet Prep HPF POC: NONE SEEN

## 2016-05-02 LAB — PREGNANCY, URINE: Preg Test, Ur: POSITIVE — AB

## 2016-05-02 LAB — HCG, QUANTITATIVE, PREGNANCY: hCG, Beta Chain, Quant, S: 87200 m[IU]/mL — ABNORMAL HIGH (ref <5)

## 2016-05-02 MED ORDER — CYCLOBENZAPRINE HCL 10 MG PO TABS
10.0000 mg | ORAL_TABLET | Freq: Once | ORAL | Status: AC
  Administered 2016-05-02: 10 mg via ORAL
  Filled 2016-05-02: qty 1

## 2016-05-02 MED ORDER — CYCLOBENZAPRINE HCL 10 MG PO TABS: 10.0000 mg | ORAL_TABLET | Freq: Three times a day (TID) | ORAL | 0 refills | Status: DC | PRN

## 2016-05-02 MED FILL — CYCLOBENZAPRINE 10 MG TAB: 10 | 7 days supply | Qty: 21 | Fill #0

## 2016-05-02 NOTE — ED Triage Notes (Signed)
Low back pain since Saturday, possibly related to riding water slides at the beach, taking ibuprofen without relief.

## 2016-05-02 NOTE — Discharge Instructions (Signed)
Medications: Flexeril  Treatment: Take Flexeril as prescribed for your pain and muscle spasms. Do not drive or operate machinery when taking this medication. This medication is category B, which means that we cannot be sure how will affect your pregnancy because there are no adequate, well-controlled studies in pregnant women, however there are no known risks on on animal trials. You can take Tylenol as prescribed over-the-counter for your pain also. Attempt the back exercises and stretches as tolerated. Use moist heat 3-4 times daily alternating 20 minutes on, 20 minutes off. Massage may be of benefit to as well.  Follow-up: Please follow-up with your primary care provider and OB/GYN for further evaluation and management. Please return to the emergency department if you develop any new or worsening symptoms.  Your ultrasound showed that you are 6 weeks, 6 days pregnant. It shows a single intrauterine pregnancy.

## 2016-05-02 NOTE — ED Provider Notes (Signed)
[facility] Provider Note   CSN: [id number] Arrival date & time: 05/02/16  [id number]     History   Chief Complaint Chief Complaint  Patient presents with  . Back Pain    HPI Kara [name] is a 30 y.o. female with history of hepatitis C who presents with low back pain. Patient reports she began having low back pain, worse on the right side, that began after she was at the beach this weekend. Patient states she was on a lot of water slides and does remember possibly hitting her back on the bottom of pool. Patient has had intermittent pain to her right side shooting down her right leg as well as wrapping around to her front right thigh. Patient describes her constant pain as achy. The pain is worse with movement. Patient has been taking ibuprofen without relief. Patient denies any bowel or bladder incontinence, saddle anesthesia. Patient does have a distant history of IV drug use, 6-7 years ago. Patient denies any recent surgeries, cancer, fevers, chest pain, shortness of breath, abdominal pain, nausea, vomiting, urinary symptoms, numbness, weakness. Patient's LMP was mid-August. She denies any abnormal discharge or bleeding.  HPI  Past Medical History:  Diagnosis Date  . Hepatitis C    Dx 2009  . Infection   . Preterm labor   . STD (female)   . Vaginal Pap smear, abnormal    cryo therapy 2008    Patient Active Problem List   Diagnosis Date Noted  . Normal delivery 04/04/2014  . Active labor at term 04/03/2014  . Amniotic fluid leaking 04/03/2014  . History of heroin abuse 12/25/2011  . History of hepatitis C 10/01/2011  . Suboxone maintenance treatment complicating pregnancy, antepartum (HCC) 09/27/2011    Past Surgical History:  Procedure Laterality Date  . extraction of wisdome teeth     age 30    OB History    Gravida Para Term Preterm AB Living   6 3 2 1 2 3    SAB TAB Ectopic Multiple Live Births   0 2 0 0 3       Home Medications    Prior to  Admission medications   Medication Sig Start Date End Date Taking? Authorizing Provider  Buprenorphine HCl-Naloxone HCl (SUBOXONE) 8-2 MG FILM Place 1-2 Film under the tongue 3 (three) times daily. Patient states she takes 1 to 2 filmstrip SL three times daily (morning, lunch, and dinner).  She decides on dose based on symptoms.   Yes Historical Provider, MD  cephALEXin (KEFLEX) 500 MG capsule Take 1 capsule (500 mg total) by mouth 4 (four) times daily. 03/27/15   Tatyana Kirichenko, PA-C  cyclobenzaprine (FLEXERIL) 10 MG tablet Take 1 tablet (10 mg total) by mouth 3 (three) times daily as needed for muscle spasms. 05/02/16   Emi HolesAlexandra M Siya Flurry, PA-C  Prenat w/o A Vit-FeFum-FePo-FA (CONCEPT OB) 130-92.4-1 MG CAPS Take 1 tablet by mouth daily. 09/27/11   Dorathy KinsmanVirginia Smith, CNM  sertraline (ZOLOFT) 50 MG tablet Take 1 tablet (50 mg total) by mouth daily. Take 1/2 tablet daily x 1 week, then 1 tablet daily 09/27/11 09/26/12  Dorathy KinsmanVirginia Smith, CNM  traMADol (ULTRAM) 50 MG tablet Take 1 tablet (50 mg total) by mouth every 6 (six) hours as needed. 03/27/15   Jaynie Crumbleatyana Kirichenko, PA-C    Family History Family History  Problem Relation Age of Onset  . Adopted: Yes    Social History Social History  Substance Use Topics  . Smoking status: Current Some  Day Smoker    Packs/day: 0.50    Years: 4.00    Types: Cigarettes  . Smokeless tobacco: Never Used  . Alcohol use No     Allergies   Review of patient's allergies indicates no known allergies.   Review of Systems Review of Systems  Constitutional: Negative for chills and fever.  HENT: Negative for facial swelling and sore throat.   Respiratory: Negative for shortness of breath.   Cardiovascular: Negative for chest pain.  Gastrointestinal: Negative for abdominal pain, nausea and vomiting.  Genitourinary: Negative for dysuria and frequency.  Musculoskeletal: Positive for back pain. Negative for neck pain.  Skin: Negative for rash and wound.  Neurological:  Negative for weakness, numbness and headaches.  Psychiatric/Behavioral: The patient is not nervous/anxious.      Physical Exam Updated Vital Signs BP 113/66 (BP Location: Right Arm)   Pulse 72   Temp 98.1 F (36.7 C) (Oral)   Resp 18   Ht 5\' 3"  (1.6 m)   Wt 86.2 kg   LMP 04/08/2016   SpO2 98%   BMI 33.66 kg/m   Physical Exam  Constitutional: She appears well-developed and well-nourished. No distress.  HENT:  Head: Normocephalic and atraumatic.  Mouth/Throat: Oropharynx is clear and moist. No oropharyngeal exudate.  Eyes: Conjunctivae and EOM are normal. Pupils are equal, round, and reactive to light. Right eye exhibits no discharge. Left eye exhibits no discharge. No scleral icterus.  Neck: Normal range of motion. Neck supple. No thyromegaly present.  Cardiovascular: Normal rate, regular rhythm, normal heart sounds and intact distal pulses.  Exam reveals no gallop and no friction rub.   No murmur heard. Pulmonary/Chest: Effort normal and breath sounds normal. No stridor. No respiratory distress. She has no wheezes. She has no rales.  Abdominal: Soft. Bowel sounds are normal. She exhibits no distension. There is no tenderness. There is no rebound and no guarding.  Musculoskeletal: She exhibits no edema.       Lumbar back: She exhibits tenderness, bony tenderness and spasm.       Back:  Lymphadenopathy:    She has no cervical adenopathy.  Neurological: She is alert. Coordination normal.  CN 3-12 intact; normal sensation throughout; 5/5 strength in all 4 extremities; equal bilateral grip strength; no ataxia on finger to nose  Skin: Skin is warm and dry. No rash noted. She is not diaphoretic. No pallor.  Psychiatric: She has a normal mood and affect.  Nursing note and vitals reviewed.    ED Treatments / Results  Labs (all labs ordered are listed, but only abnormal results are displayed) Labs Reviewed  WET PREP, GENITAL - Abnormal; Notable for the following:       Result  Value   WBC, Wet Prep HPF POC MANY (*)    All other components within normal limits  PREGNANCY, URINE - Abnormal; Notable for the following:    Preg Test, Ur POSITIVE (*)    All other components within normal limits  HCG, QUANTITATIVE, PREGNANCY - Abnormal; Notable for the following:    hCG, Beta Chain, Quant, S 87,200 (*)    All other components within normal limits  URINALYSIS, ROUTINE W REFLEX MICROSCOPIC (NOT AT [facility])  RPR  HIV ANTIBODY (ROUTINE TESTING)  GC/CHLAMYDIA PROBE AMP ([place]) NOT AT [facility]    EKG  EKG Interpretation None       Radiology US Ob Comp Less 14 Wks  Result Date: 05/02/2016 CLINICAL DATA:  Right-sided low back and pelvic pain for 5  days. Gestational age by LMP of 3 weeks 3 days. EXAM: OBSTETRIC <14 WK [place] AND TRANSVAGINAL OB US TECHNIQUE: Both transabdominal and transvaginal ultrasound examinations were performed for complete evaluation of the gestation as well as the maternal uterus, adnexal regions, and pelvic cul-de-sac. Transvaginal technique was performed to assess early pregnancy. COMPARISON:  None. FINDINGS: Intrauterine gestational sac: Single Yolk sac:  Visualized Embryo:  Visualized Cardiac Activity: Visualized Heart Rate: 133  bpm CRL:  8  mm   6 w   6 d                  [place] EDC: 12/20/2016 Subchorionic hemorrhage:  Tiny subchorionic hemorrhage noted. Maternal uterus/adnexae: 1.6 cm left ovarian corpus luteum cyst noted. Normal appearance of right ovary. No mass or free fluid identified. IMPRESSION: Single living IUP measuring 6 weeks 6 days with [place] EDC of 12/20/2016. Tiny subchorionic hemorrhage. Electronically Signed   By: Myles Rosenthal M.D.   On: 05/02/2016 11:54   US Ob Transvaginal  Result Date: 05/02/2016 CLINICAL DATA:  Right-sided low back and pelvic pain for 5 days. Gestational age by LMP of 3 weeks 3 days. EXAM: OBSTETRIC <14 WK [place] AND TRANSVAGINAL OB US TECHNIQUE: Both transabdominal and transvaginal ultrasound examinations were performed for  complete evaluation of the gestation as well as the maternal uterus, adnexal regions, and pelvic cul-de-sac. Transvaginal technique was performed to assess early pregnancy. COMPARISON:  None. FINDINGS: Intrauterine gestational sac: Single Yolk sac:  Visualized Embryo:  Visualized Cardiac Activity: Visualized Heart Rate: 133  bpm CRL:  8  mm   6 w   6 d                  [place] EDC: 12/20/2016 Subchorionic hemorrhage:  Tiny subchorionic hemorrhage noted. Maternal uterus/adnexae: 1.6 cm left ovarian corpus luteum cyst noted. Normal appearance of right ovary. No mass or free fluid identified. IMPRESSION: Single living IUP measuring 6 weeks 6 days with [place] EDC of 12/20/2016. Tiny subchorionic hemorrhage. Electronically Signed   By: Myles Rosenthal M.D.   On: 05/02/2016 11:54    Procedures Procedures (including critical care time)  Medications Ordered in ED Medications  cyclobenzaprine (FLEXERIL) tablet 10 mg (10 mg Oral Given 05/02/16 1252)     Initial Impression / Assessment and Plan / ED Course  I have reviewed the triage vital signs and the nursing notes.  Pertinent labs & imaging results that were available during my care of the patient were reviewed by me and considered in my medical decision making (see chart for details).  Clinical Course   Low suspicion of fracture, only mild midline tenderness. Mostly right sided tenderness. Will order lumbar x-ray, UA, Upreg.  Patient with most likely muscle strain. Unable to complete lumbar x-ray due to positive pregnancy test. HCG quantitative 87,200. UA negative. GC/Chlamydia, RPR, HIV sent. Wet prep shows many WBCs. Patient has no concern for STD exposure and would not like to be treated prior to culture. She is advised that she'll be called in 2-3 days with any positive culture. Pelvic ultrasound shows single living IUP measuring 6 weeks, 6 days with ultrasound EDC of 12/20/2016. Patient to follow-up with primary care provider and OB/GYN for further evaluation and  treatment. Patient discharged with Flexeril and advised to take Tylenol as prescribed over-the-counter. Supportive treatment discussed including stretching, moist heat. Return precautions discussed. Patient understands and agrees with plan. Patient vitals stable throughout ED course and discharged in satisfactory condition. I discussed patient case with Dr.  Delo who guided the patient's management and agrees with plan.   Final Clinical Impressions(s) / ED Diagnoses   Final diagnoses:  Back pain  Positive pregnancy test  Right-sided low back pain with right-sided sciatica    New Prescriptions Discharge Medication List as of 05/02/2016 12:48 PM    START taking these medications   Details  cyclobenzaprine (FLEXERIL) 10 MG tablet Take 1 tablet (10 mg total) by mouth 3 (three) times daily as needed for muscle spasms., Starting Thu 05/02/2016, Print         [address], PA-C 05/02/16 1604    Geoffery Lyons, MD 05/03/16 [phone]

## 2016-05-03 LAB — GC/CHLAMYDIA PROBE AMP (~~LOC~~) NOT AT ARMC
Chlamydia: NEGATIVE
Neisseria Gonorrhea: NEGATIVE

## 2016-05-03 LAB — RPR: RPR Ser Ql: NONREACTIVE

## 2016-05-03 LAB — HIV ANTIBODY (ROUTINE TESTING W REFLEX): HIV Screen 4th Generation wRfx: NONREACTIVE

## 2018-08-26 NOTE — L&D Delivery Note (Addendum)
Delivery Note At 3:34 PM a viable female was delivered via  (Presentation: Vertex; LOA).  APGAR: 8,9; weight 3380g 7lb 7.2oz.   Placenta status: Spontaneous, intact.  Cord: 3 vessels with the following complications: marginal cord insertion.    Anesthesia: Epidural Episiotomy:  None Lacerations: None Suture Repair: N/A Est. Blood Loss (mL):  400  Mom to postpartum.  Baby to Couplet care / Skin to Skin.  Gerlene Fee, DO Family Medicine Resident, PGY-1 03/29/2019, 4:02 PM  Attestation: I was present for the entire delivery. No lacerations noted, bleeding well-controlled. Dr. Roselie Awkward aware patient would like BTL, will be into room to consent patient.  [name], DO OB/GYN Fellow

## 2019-02-25 ENCOUNTER — Ambulatory Visit (INDEPENDENT_AMBULATORY_CARE_PROVIDER_SITE_OTHER): Payer: Medicaid Other | Admitting: Family Medicine

## 2019-02-25 ENCOUNTER — Encounter: Payer: Self-pay | Admitting: Family Medicine

## 2019-02-25 ENCOUNTER — Other Ambulatory Visit (HOSPITAL_COMMUNITY)
Admission: RE | Admit: 2019-02-25 | Discharge: 2019-02-25 | Disposition: A | Discharge status: Home / Self Care | Payer: Medicaid Other | Source: Ambulatory Visit | Attending: Family Medicine | Admitting: Family Medicine

## 2019-02-25 ENCOUNTER — Other Ambulatory Visit: Payer: Self-pay

## 2019-02-25 VITALS — BP 110/50 | HR 76 | Wt 224.0 lb

## 2019-02-25 DIAGNOSIS — O09892 Supervision of other high risk pregnancies, second trimester: Secondary | ICD-10-CM | POA: Diagnosis not present

## 2019-02-25 DIAGNOSIS — B182 Chronic viral hepatitis C: Secondary | ICD-10-CM

## 2019-02-25 DIAGNOSIS — Z3A32 32 weeks gestation of pregnancy: Secondary | ICD-10-CM

## 2019-02-25 DIAGNOSIS — O0930 Supervision of pregnancy with insufficient antenatal care, unspecified trimester: Secondary | ICD-10-CM

## 2019-02-25 DIAGNOSIS — O09899 Supervision of other high risk pregnancies, unspecified trimester: Secondary | ICD-10-CM | POA: Insufficient documentation

## 2019-02-25 DIAGNOSIS — O9989 Other specified diseases and conditions complicating pregnancy, childbirth and the puerperium: Secondary | ICD-10-CM

## 2019-02-25 DIAGNOSIS — Z283 Underimmunization status: Secondary | ICD-10-CM

## 2019-02-25 DIAGNOSIS — Z8619 Personal history of other infectious and parasitic diseases: Secondary | ICD-10-CM

## 2019-02-25 DIAGNOSIS — F1120 Opioid dependence, uncomplicated: Secondary | ICD-10-CM

## 2019-02-25 DIAGNOSIS — O99320 Drug use complicating pregnancy, unspecified trimester: Secondary | ICD-10-CM

## 2019-02-25 DIAGNOSIS — Z2839 Other underimmunization status: Secondary | ICD-10-CM

## 2019-02-25 DIAGNOSIS — O0932 Supervision of pregnancy with insufficient antenatal care, second trimester: Secondary | ICD-10-CM

## 2019-02-25 DIAGNOSIS — O99322 Drug use complicating pregnancy, second trimester: Secondary | ICD-10-CM | POA: Diagnosis not present

## 2019-02-25 NOTE — Progress Notes (Signed)
Subjective:  Kara [name] is a [id number] 5635w1d being seen today for her first obstetrical visit.  Her obstetrical history is significant for three prior vaginal deliveries, including one premature delivery (2 subsequent term deliveries). Patient is on suboxone for h/o opiate abuse. She was on suboxone with prior pregnancies. She also has Hep C, although had testing last year that showed negative antibodies and no viral load.. Patient does intend to breast feed. Pregnancy history fully reviewed.  Patient reports no complaints.  BP (!) 110/50   Pulse 76   Wt 224 lb (101.6 kg)   LMP 07/15/2018 (Approximate)   BMI 39.68 kg/m   HISTORY: OB History  Gravida Para Term Preterm AB Living  6 3 2 1 2 3   SAB TAB Ectopic Multiple Live Births  0 2 0 0 3    # Outcome Date GA Lbr Len/2nd Weight Sex Delivery Anes PTL Lv  6 Current           5 Term 04/04/14 10175w1d 07:35 / 01:54 7 lb 7.1 oz (3.375 kg) F Vag-Spont EPI  LIV  4 TAB 2014          3 Term 12/26/11 8269w6d 23:05 / 00:24 7 lb 6.7 oz (3.365 kg) F Vag-Spont EPI  LIV     Birth Comments: wnl  2 Preterm 05/05/09 4723w0d   F Vag-Spont EPI N LIV  1 TAB 2009            Past Medical History:  Diagnosis Date  . Hepatitis C    Dx 2009  . Infection   . Preterm labor   . STD (female)   . Vaginal Pap smear, abnormal    cryo therapy 2008    Past Surgical History:  Procedure Laterality Date  . extraction of wisdome teeth     age 33    Family History  Adopted: Yes     Exam  BP (!) 110/50   Pulse 76   Wt 224 lb (101.6 kg)   LMP 07/15/2018 (Approximate)   BMI 39.68 kg/m   CONSTITUTIONAL: Well-developed, well-nourished female in no acute distress.  HENT:  Normocephalic, atraumatic, External right and left ear normal. Oropharynx is clear and moist EYES: Conjunctivae and EOM are normal. Pupils are equal, round, and reactive to light. No scleral icterus.  NECK: Normal range of motion, supple, no masses.  Normal thyroid.  CARDIOVASCULAR:  Normal heart rate noted, regular rhythm RESPIRATORY: Clear to auscultation bilaterally. Effort and breath sounds normal, no problems with respiration noted. BREASTS: deferred ABDOMEN: Soft, normal bowel sounds, no distention noted.  No tenderness, rebound or guarding.  PELVIC: deferred MUSCULOSKELETAL: Normal range of motion. No tenderness.  No cyanosis, clubbing, or edema.  2+ distal pulses. SKIN: Skin is warm and dry. No rash noted. Not diaphoretic. No erythema. No pallor. NEUROLOGIC: Alert and oriented to person, place, and time. Normal reflexes, muscle tone coordination. No cranial nerve deficit noted. PSYCHIATRIC: Normal mood and affect. Normal behavior. Normal judgment and thought content.    Assessment:    Pregnancy: [id number] Patient Active Problem List   Diagnosis Date Noted  . Supervision of other high risk pregnancy, antepartum 02/25/2019  . History of heroin abuse (HCC) 12/25/2011  . History of hepatitis C 10/01/2011  . Suboxone maintenance treatment complicating pregnancy, antepartum (HCC) 09/27/2011      Plan:   1. Supervision of other high risk pregnancy, antepartum Measuring 34 weeks. Will get US to look at dating. Panorama for T21 screening PNL - Culture,  OB Urine - Obstetric Panel, Including HIV - SMN1 COPY NUMBER ANALYSIS (SMA Carrier Screen) - Cystic fibrosis gene test - Hepatitis C Antibody - [place] MFM OB DETAIL +14 WK; Future - Glucose tolerance, 1 hour - Genetic Screening - GC/Chlamydia probe amp ([place])not at [facility] - POC Urinalysis Dipstick OB  2. Suboxone maintenance treatment complicating pregnancy, antepartum ([place]) Stable Continue treatment Has treatment with [facility]. Discussed 5 day observation for baby for NAS.  All deliveries were at [facility]. - Culture, OB Urine - Obstetric Panel, Including HIV - SMN1 COPY NUMBER ANALYSIS (SMA Carrier Screen) - Cystic fibrosis gene test - Hepatitis C Antibody - [place] MFM OB DETAIL +14 WK; Future - Glucose  tolerance, 1 hour - Genetic Screening  3. History of hepatitis C Check Hep C antibody - Culture, OB Urine - Obstetric Panel, Including HIV - SMN1 COPY NUMBER ANALYSIS (SMA Carrier Screen) - Cystic fibrosis gene test - Hepatitis C Antibody - [place] MFM OB DETAIL +14 WK; Future - Glucose tolerance, 1 hour - Genetic Screening  4. Late prenatal care affecting pregnancy, antepartum   Problem list reviewed and updated. 75% of 30 min visit spent on counseling and coordination of care.     Truett Mainland 02/25/2019

## 2019-03-01 ENCOUNTER — Telehealth: Payer: Self-pay

## 2019-03-01 NOTE — Telephone Encounter (Signed)
Results in chart

## 2019-03-03 LAB — GC/CHLAMYDIA PROBE AMP (~~LOC~~) NOT AT ARMC
Chlamydia: NEGATIVE
Neisseria Gonorrhea: NEGATIVE

## 2019-03-05 ENCOUNTER — Telehealth: Payer: Self-pay

## 2019-03-05 LAB — CULTURE, OB URINE

## 2019-03-05 NOTE — Telephone Encounter (Signed)
Received call from [facility] they had a mixed up in orders and had to cancel urine cultures. Please recollected at next office visit.

## 2019-03-11 ENCOUNTER — Ambulatory Visit (HOSPITAL_COMMUNITY): Payer: Medicaid Other | Admitting: *Deleted

## 2019-03-11 ENCOUNTER — Encounter (HOSPITAL_COMMUNITY): Payer: Self-pay

## 2019-03-11 ENCOUNTER — Other Ambulatory Visit: Payer: Self-pay

## 2019-03-11 ENCOUNTER — Ambulatory Visit (HOSPITAL_COMMUNITY)
Admission: RE | Admit: 2019-03-11 | Discharge: 2019-03-11 | Disposition: A | Discharge status: Home / Self Care | Payer: Medicaid Other | Source: Ambulatory Visit | Attending: Obstetrics and Gynecology | Admitting: Obstetrics and Gynecology

## 2019-03-11 VITALS — BP 130/68 | HR 74 | Temp 98.1°F

## 2019-03-11 DIAGNOSIS — F1120 Opioid dependence, uncomplicated: Secondary | ICD-10-CM | POA: Diagnosis present

## 2019-03-11 DIAGNOSIS — O99320 Drug use complicating pregnancy, unspecified trimester: Secondary | ICD-10-CM | POA: Diagnosis present

## 2019-03-11 DIAGNOSIS — O98413 Viral hepatitis complicating pregnancy, third trimester: Secondary | ICD-10-CM

## 2019-03-11 DIAGNOSIS — O99323 Drug use complicating pregnancy, third trimester: Secondary | ICD-10-CM

## 2019-03-11 DIAGNOSIS — Z3A36 36 weeks gestation of pregnancy: Secondary | ICD-10-CM

## 2019-03-11 DIAGNOSIS — Z8619 Personal history of other infectious and parasitic diseases: Secondary | ICD-10-CM | POA: Diagnosis present

## 2019-03-11 DIAGNOSIS — O09213 Supervision of pregnancy with history of pre-term labor, third trimester: Secondary | ICD-10-CM

## 2019-03-11 DIAGNOSIS — B182 Chronic viral hepatitis C: Secondary | ICD-10-CM

## 2019-03-11 DIAGNOSIS — O09899 Supervision of other high risk pregnancies, unspecified trimester: Secondary | ICD-10-CM | POA: Insufficient documentation

## 2019-03-11 DIAGNOSIS — O99213 Obesity complicating pregnancy, third trimester: Secondary | ICD-10-CM

## 2019-03-11 DIAGNOSIS — Z283 Underimmunization status: Secondary | ICD-10-CM | POA: Insufficient documentation

## 2019-03-11 LAB — SMN1 COPY NUMBER ANALYSIS (SMA CARRIER SCREENING)

## 2019-03-11 LAB — OBSTETRIC PANEL, INCLUDING HIV
Antibody Screen: NEGATIVE
Basophils Absolute: 0 10*3/uL (ref 0.0–0.2)
Basos: 0 %
EOS (ABSOLUTE): 0.2 10*3/uL (ref 0.0–0.4)
Eos: 2 %
HIV Screen 4th Generation wRfx: NONREACTIVE
Hematocrit: 32.5 % — ABNORMAL LOW (ref 34.0–46.6)
Hemoglobin: 11.5 g/dL (ref 11.1–15.9)
Hepatitis B Surface Ag: NEGATIVE
Immature Grans (Abs): 0.5 10*3/uL — ABNORMAL HIGH (ref 0.0–0.1)
Immature Granulocytes: 4 %
Lymphocytes Absolute: 2.8 10*3/uL (ref 0.7–3.1)
Lymphs: 20 %
MCH: 31.6 pg (ref 26.6–33.0)
MCHC: 35.4 g/dL (ref 31.5–35.7)
MCV: 89 fL (ref 79–97)
Monocytes Absolute: 1 10*3/uL — ABNORMAL HIGH (ref 0.1–0.9)
Monocytes: 7 %
Neutrophils Absolute: 9.8 10*3/uL — ABNORMAL HIGH (ref 1.4–7.0)
Neutrophils: 67 %
Platelets: 196 10*3/uL (ref 150–450)
RBC: 3.64 x10E6/uL — ABNORMAL LOW (ref 3.77–5.28)
RDW: 11.9 % (ref 11.7–15.4)
RPR Ser Ql: NONREACTIVE
Rh Factor: NEGATIVE
Rubella Antibodies, IGG: <0.9 index — ABNORMAL LOW (ref >0.99)
WBC: 14.4 10*3/uL — ABNORMAL HIGH (ref 3.4–10.8)

## 2019-03-11 LAB — HEPATITIS C ANTIBODY: Hep C Virus Ab: >11 s/co ratio — ABNORMAL HIGH (ref 0.0–0.9)

## 2019-03-11 LAB — GLUCOSE TOLERANCE, 1 HOUR: Glucose, 1Hr PP: 137 mg/dL (ref 65–199)

## 2019-03-11 LAB — CYSTIC FIBROSIS GENE TEST

## 2019-03-12 ENCOUNTER — Telehealth: Payer: Self-pay

## 2019-03-12 NOTE — Telephone Encounter (Signed)
Left message for patient to return call to office.   Needs 2 hr gtt. Kathrene Alu RN

## 2019-03-12 NOTE — Telephone Encounter (Signed)
-----  Message from Donnamae Jude, MD sent at 03/11/2019  8:55 AM EDT ----- She did not pass her 1 hour and needs further testing-- Also her urine culture needs to be re-collected. She will need an MMR pp--she is not immune to rubella

## 2019-03-15 ENCOUNTER — Encounter: Payer: Medicaid Other | Admitting: Obstetrics & Gynecology

## 2019-03-15 NOTE — Telephone Encounter (Signed)
Patient called and made aware of need for 2 hour gtt. Patient will do tomorrow and combine it with her ob appt. Kathrene Alu RN

## 2019-03-16 ENCOUNTER — Encounter: Payer: Medicaid Other | Admitting: Advanced Practice Midwife

## 2019-03-17 ENCOUNTER — Telehealth: Payer: Self-pay

## 2019-03-17 NOTE — Telephone Encounter (Signed)
Patient called because she missed her appointment on 03/17/2019.  Left message for patient to return call to office. Kathrene Alu RN

## 2019-03-18 ENCOUNTER — Telehealth: Payer: Self-pay

## 2019-03-18 NOTE — Telephone Encounter (Signed)
Patient called the after hours line last night (511pm) returning my call to reschedule her appointment she missed this week.   Called patient back and left another message requesting she call back before 5pm today to reschedule her appointment. Kathrene Alu RN

## 2019-03-25 ENCOUNTER — Other Ambulatory Visit: Payer: Self-pay

## 2019-03-25 ENCOUNTER — Other Ambulatory Visit (HOSPITAL_COMMUNITY)
Admission: RE | Admit: 2019-03-25 | Discharge: 2019-03-25 | Disposition: A | Discharge status: Home / Self Care | Payer: Medicaid Other | Source: Ambulatory Visit | Attending: Family Medicine | Admitting: Family Medicine

## 2019-03-25 ENCOUNTER — Ambulatory Visit (INDEPENDENT_AMBULATORY_CARE_PROVIDER_SITE_OTHER): Payer: Medicaid Other | Admitting: Family Medicine

## 2019-03-25 VITALS — BP 117/79 | HR 87 | Wt 229.0 lb

## 2019-03-25 DIAGNOSIS — O09899 Supervision of other high risk pregnancies, unspecified trimester: Secondary | ICD-10-CM | POA: Diagnosis not present

## 2019-03-25 DIAGNOSIS — Z3A38 38 weeks gestation of pregnancy: Secondary | ICD-10-CM

## 2019-03-25 DIAGNOSIS — O360130 Maternal care for anti-D [Rh] antibodies, third trimester, not applicable or unspecified: Secondary | ICD-10-CM | POA: Diagnosis not present

## 2019-03-25 DIAGNOSIS — Z23 Encounter for immunization: Secondary | ICD-10-CM | POA: Diagnosis not present

## 2019-03-25 DIAGNOSIS — O0933 Supervision of pregnancy with insufficient antenatal care, third trimester: Secondary | ICD-10-CM

## 2019-03-25 DIAGNOSIS — O99320 Drug use complicating pregnancy, unspecified trimester: Secondary | ICD-10-CM

## 2019-03-25 DIAGNOSIS — O99323 Drug use complicating pregnancy, third trimester: Secondary | ICD-10-CM

## 2019-03-25 DIAGNOSIS — Z283 Underimmunization status: Secondary | ICD-10-CM

## 2019-03-25 DIAGNOSIS — Z2839 Other underimmunization status: Secondary | ICD-10-CM

## 2019-03-25 DIAGNOSIS — O09893 Supervision of other high risk pregnancies, third trimester: Secondary | ICD-10-CM

## 2019-03-25 DIAGNOSIS — Z6791 Unspecified blood type, Rh negative: Secondary | ICD-10-CM

## 2019-03-25 DIAGNOSIS — O9989 Other specified diseases and conditions complicating pregnancy, childbirth and the puerperium: Secondary | ICD-10-CM

## 2019-03-25 DIAGNOSIS — F1120 Opioid dependence, uncomplicated: Secondary | ICD-10-CM

## 2019-03-25 DIAGNOSIS — O0930 Supervision of pregnancy with insufficient antenatal care, unspecified trimester: Secondary | ICD-10-CM

## 2019-03-25 DIAGNOSIS — Z8619 Personal history of other infectious and parasitic diseases: Secondary | ICD-10-CM

## 2019-03-25 LAB — OB RESULTS CONSOLE GBS: GBS: POSITIVE

## 2019-03-25 MED ORDER — RHO D IMMUNE GLOBULIN 1500 UNIT/2ML IJ SOSY
300.0000 ug | PREFILLED_SYRINGE | Freq: Once | INTRAMUSCULAR | Status: AC
  Administered 2019-03-25: 10:00:00 300 ug via INTRAMUSCULAR

## 2019-03-25 NOTE — Progress Notes (Signed)
   PRENATAL VISIT NOTE  Subjective:  Kara [name] is a 33 y.o. [id number] at [redacted]w[redacted]d being seen today for ongoing prenatal care.  She is currently monitored for the following issues for this high-risk pregnancy and has Suboxone maintenance treatment complicating pregnancy, antepartum ([place]); Hepatitis C; History of heroin abuse ([place]); Supervision of other high risk pregnancy, antepartum; and Rubella non-immune status, antepartum on their problem list.  Patient reports no complaints.  Contractions: Not present. Vag. Bleeding: None.  Movement: Present. Denies leaking of fluid.   The following portions of the patient's history were reviewed and updated as appropriate: allergies, current medications, past family history, past medical history, past social history, past surgical history and problem list.   Objective:   Vitals:   03/25/19 0921  BP: 117/79  Pulse: 87  Weight: 229 lb (103.9 kg)    Fetal Status: Fetal Heart Rate (bpm): 138   Movement: Present     General:  Alert, oriented and cooperative. Patient is in no acute distress.  Skin: Skin is warm and dry. No rash noted.   Cardiovascular: Normal heart rate noted  Respiratory: Normal respiratory effort, no problems with respiration noted  Abdomen: Soft, gravid, appropriate for gestational age.  Pain/Pressure: Present     Pelvic: Cervical exam deferred        Extremities: Normal range of motion.  Edema: None  Mental Status: Normal mood and affect. Normal behavior. Normal judgment and thought content.   Assessment and Plan:  Pregnancy: [id number] at [redacted]w[redacted]d 1. Supervision of other high risk pregnancy, antepartum FHT and FH normal. GBS collected. 2hr GTT today - rho (d) immune globulin (RHIG/RHOPHYLAC) injection 300 mcg - Tdap vaccine greater than or equal to 7yo IM - CBC - Glucose Tolerance, 2 Hours w/1 Hour - HIV Antibody (routine testing w rflx) - RPR - HCV RNA quant - Culture, beta strep (group b only) - GC/Chlamydia probe amp  ([place])not at [facility] - Culture, OB Urine  2. Rh negative, antepartum - rho (d) immune globulin (RHIG/RHOPHYLAC) injection 300 mcg  3. Suboxone maintenance treatment complicating pregnancy, antepartum (HCC) Stable on medications  4. History of hepatitis C Check viral load  5. Rubella non-immune status, antepartum  6. Late prenatal care affecting pregnancy, antepartum   Term labor symptoms and general obstetric precautions including but not limited to vaginal bleeding, contractions, leaking of fluid and fetal movement were reviewed in detail with the patient. Please refer to After Visit Summary for other counseling recommendations.   Return in about 1 week (around 04/01/2019) for In Office.  No future appointments.  Truett Mainland, DO

## 2019-03-27 LAB — CBC
Hematocrit: 36.4 % (ref 34.0–46.6)
Hemoglobin: 12.5 g/dL (ref 11.1–15.9)
MCH: 32.6 pg (ref 26.6–33.0)
MCHC: 34.3 g/dL (ref 31.5–35.7)
MCV: 95 fL (ref 79–97)
Platelets: 196 10*3/uL (ref 150–450)
RBC: 3.83 x10E6/uL (ref 3.77–5.28)
RDW: 12.6 % (ref 11.7–15.4)
WBC: 13 10*3/uL — ABNORMAL HIGH (ref 3.4–10.8)

## 2019-03-27 LAB — HCV RNA QUANT: Hepatitis C Quantitation: NOT DETECTED IU/mL

## 2019-03-27 LAB — HIV ANTIBODY (ROUTINE TESTING W REFLEX): HIV Screen 4th Generation wRfx: NONREACTIVE

## 2019-03-27 LAB — GC/CHLAMYDIA PROBE AMP (~~LOC~~) NOT AT ARMC
Chlamydia: NEGATIVE
Neisseria Gonorrhea: NEGATIVE

## 2019-03-27 LAB — GLUCOSE TOLERANCE, 2 HOURS W/ 1HR
Glucose, 1 hour: 180 mg/dL — ABNORMAL HIGH (ref 65–179)
Glucose, 2 hour: 135 mg/dL (ref 65–152)
Glucose, Fasting: 82 mg/dL (ref 65–91)

## 2019-03-27 LAB — RPR: RPR Ser Ql: NONREACTIVE

## 2019-03-28 LAB — URINE CULTURE, OB REFLEX

## 2019-03-28 LAB — CULTURE, OB URINE

## 2019-03-29 ENCOUNTER — Inpatient Hospital Stay (HOSPITAL_COMMUNITY): Payer: Medicaid Other | Admitting: Anesthesiology

## 2019-03-29 ENCOUNTER — Inpatient Hospital Stay (HOSPITAL_COMMUNITY)
Admission: AD | Admit: 2019-03-29 | Discharge: 2019-03-31 | DRG: 797 | Disposition: A | Discharge status: Home / Self Care | Payer: Medicaid Other | Attending: Obstetrics & Gynecology | Admitting: Obstetrics & Gynecology

## 2019-03-29 ENCOUNTER — Encounter (HOSPITAL_COMMUNITY): Payer: Self-pay

## 2019-03-29 ENCOUNTER — Inpatient Hospital Stay (HOSPITAL_COMMUNITY): Payer: Medicaid Other | Admitting: Certified Registered Nurse Anesthetist

## 2019-03-29 ENCOUNTER — Other Ambulatory Visit: Payer: Self-pay

## 2019-03-29 ENCOUNTER — Encounter (HOSPITAL_COMMUNITY)
Admission: AD | Disposition: A | Discharge status: Home / Self Care | Payer: Self-pay | Source: Home / Self Care | Attending: Obstetrics & Gynecology

## 2019-03-29 ENCOUNTER — Telehealth: Payer: Self-pay

## 2019-03-29 DIAGNOSIS — B1920 Unspecified viral hepatitis C without hepatic coma: Secondary | ICD-10-CM | POA: Diagnosis not present

## 2019-03-29 DIAGNOSIS — O9842 Viral hepatitis complicating childbirth: Secondary | ICD-10-CM | POA: Diagnosis present

## 2019-03-29 DIAGNOSIS — Z87891 Personal history of nicotine dependence: Secondary | ICD-10-CM | POA: Diagnosis not present

## 2019-03-29 DIAGNOSIS — O24420 Gestational diabetes mellitus in childbirth, diet controlled: Secondary | ICD-10-CM | POA: Diagnosis present

## 2019-03-29 DIAGNOSIS — O26899 Other specified pregnancy related conditions, unspecified trimester: Secondary | ICD-10-CM

## 2019-03-29 DIAGNOSIS — Z302 Encounter for sterilization: Secondary | ICD-10-CM

## 2019-03-29 DIAGNOSIS — B182 Chronic viral hepatitis C: Secondary | ICD-10-CM | POA: Diagnosis present

## 2019-03-29 DIAGNOSIS — Z3A39 39 weeks gestation of pregnancy: Secondary | ICD-10-CM

## 2019-03-29 DIAGNOSIS — O24419 Gestational diabetes mellitus in pregnancy, unspecified control: Secondary | ICD-10-CM

## 2019-03-29 DIAGNOSIS — O26893 Other specified pregnancy related conditions, third trimester: Secondary | ICD-10-CM | POA: Diagnosis present

## 2019-03-29 DIAGNOSIS — Z20828 Contact with and (suspected) exposure to other viral communicable diseases: Secondary | ICD-10-CM | POA: Diagnosis present

## 2019-03-29 DIAGNOSIS — O99320 Drug use complicating pregnancy, unspecified trimester: Secondary | ICD-10-CM

## 2019-03-29 DIAGNOSIS — O99824 Streptococcus B carrier state complicating childbirth: Secondary | ICD-10-CM | POA: Diagnosis present

## 2019-03-29 DIAGNOSIS — O98419 Viral hepatitis complicating pregnancy, unspecified trimester: Secondary | ICD-10-CM | POA: Diagnosis present

## 2019-03-29 DIAGNOSIS — Z283 Underimmunization status: Secondary | ICD-10-CM

## 2019-03-29 DIAGNOSIS — Z23 Encounter for immunization: Secondary | ICD-10-CM

## 2019-03-29 DIAGNOSIS — O99324 Drug use complicating childbirth: Secondary | ICD-10-CM | POA: Diagnosis not present

## 2019-03-29 DIAGNOSIS — O24410 Gestational diabetes mellitus in pregnancy, diet controlled: Secondary | ICD-10-CM | POA: Diagnosis present

## 2019-03-29 DIAGNOSIS — O43121 Velamentous insertion of umbilical cord, first trimester: Secondary | ICD-10-CM | POA: Diagnosis present

## 2019-03-29 DIAGNOSIS — O24429 Gestational diabetes mellitus in childbirth, unspecified control: Secondary | ICD-10-CM

## 2019-03-29 DIAGNOSIS — O99214 Obesity complicating childbirth: Secondary | ICD-10-CM | POA: Diagnosis present

## 2019-03-29 DIAGNOSIS — Z6791 Unspecified blood type, Rh negative: Secondary | ICD-10-CM

## 2019-03-29 DIAGNOSIS — F1111 Opioid abuse, in remission: Secondary | ICD-10-CM | POA: Diagnosis present

## 2019-03-29 DIAGNOSIS — Z2839 Other underimmunization status: Secondary | ICD-10-CM

## 2019-03-29 DIAGNOSIS — F1120 Opioid dependence, uncomplicated: Secondary | ICD-10-CM

## 2019-03-29 HISTORY — PX: TUBAL LIGATION: SHX77

## 2019-03-29 LAB — COMPREHENSIVE METABOLIC PANEL
ALT: 9 U/L (ref 0–44)
AST: 13 U/L — ABNORMAL LOW (ref 15–41)
Albumin: 2.6 g/dL — ABNORMAL LOW (ref 3.5–5.0)
Alkaline Phosphatase: 152 U/L — ABNORMAL HIGH (ref 38–126)
Anion gap: 10 (ref 5–15)
BUN: 6 mg/dL (ref 6–20)
CO2: 20 mmol/L — ABNORMAL LOW (ref 22–32)
Calcium: 8.6 mg/dL — ABNORMAL LOW (ref 8.9–10.3)
Chloride: 105 mmol/L (ref 98–111)
Creatinine, Ser: 0.52 mg/dL (ref 0.44–1.00)
GFR calc Af Amer: >60 mL/min (ref >60)
GFR calc non Af Amer: >60 mL/min (ref >60)
Glucose, Bld: 104 mg/dL — ABNORMAL HIGH (ref 70–99)
Potassium: 3.8 mmol/L (ref 3.5–5.1)
Sodium: 135 mmol/L (ref 135–145)
Total Bilirubin: 0.6 mg/dL (ref 0.3–1.2)
Total Protein: 5.7 g/dL — ABNORMAL LOW (ref 6.5–8.1)

## 2019-03-29 LAB — POCT FERN TEST: POCT Fern Test: POSITIVE

## 2019-03-29 LAB — CBC
HCT: 36.2 % (ref 36.0–46.0)
Hemoglobin: 12.2 g/dL (ref 12.0–15.0)
MCH: 31.9 pg (ref 26.0–34.0)
MCHC: 33.7 g/dL (ref 30.0–36.0)
MCV: 94.8 fL (ref 80.0–100.0)
Platelets: 200 10*3/uL (ref 150–400)
RBC: 3.82 MIL/uL — ABNORMAL LOW (ref 3.87–5.11)
RDW: 13.1 % (ref 11.5–15.5)
WBC: 16.8 10*3/uL — ABNORMAL HIGH (ref 4.0–10.5)
nRBC: 0 % (ref 0.0–0.2)

## 2019-03-29 LAB — SARS CORONAVIRUS 2 BY RT PCR (HOSPITAL ORDER, PERFORMED IN ~~LOC~~ HOSPITAL LAB): SARS Coronavirus 2: NEGATIVE

## 2019-03-29 LAB — GLUCOSE, CAPILLARY
Glucose-Capillary: 101 mg/dL — ABNORMAL HIGH (ref 70–99)
Glucose-Capillary: 92 mg/dL (ref 70–99)
Glucose-Capillary: 107 mg/dL — ABNORMAL HIGH (ref 70–99)

## 2019-03-29 LAB — PROTEIN / CREATININE RATIO, URINE
Creatinine, Urine: 74.83 mg/dL
Protein Creatinine Ratio: 0.09 mg/mg{Cre} (ref 0.00–0.15)
Total Protein, Urine: 7 mg/dL

## 2019-03-29 LAB — RPR: RPR Ser Ql: NONREACTIVE

## 2019-03-29 LAB — CULTURE, BETA STREP (GROUP B ONLY): Strep Gp B Culture: POSITIVE — AB

## 2019-03-29 SURGERY — LIGATION, FALLOPIAN TUBE, POSTPARTUM
Anesthesia: Epidural

## 2019-03-29 MED ORDER — ACCU-CHEK NANO SMARTVIEW W/DEVICE KIT: 1.0000 | PACK | 0 refills | Status: AC

## 2019-03-29 MED ORDER — BUPRENORPHINE HCL 8 MG SL SUBL
8.0000 mg | SUBLINGUAL_TABLET | Freq: Three times a day (TID) | SUBLINGUAL | Status: DC
  Administered 2019-03-29 (×2): 8 mg via SUBLINGUAL
  Filled 2019-03-29 (×2): qty 1

## 2019-03-29 MED ORDER — PHENYLEPHRINE 40 MCG/ML (10ML) SYRINGE FOR IV PUSH (FOR BLOOD PRESSURE SUPPORT)
80.0000 ug | PREFILLED_SYRINGE | INTRAVENOUS | Status: DC | PRN
  Administered 2019-03-29 (×2): 80 ug via INTRAVENOUS

## 2019-03-29 MED ORDER — OXYTOCIN 40 UNITS IN NORMAL SALINE INFUSION - SIMPLE MED
1.0000 m[IU]/min | INTRAVENOUS | Status: DC
  Administered 2019-03-29: 2 m[IU]/min via INTRAVENOUS

## 2019-03-29 MED ORDER — EPHEDRINE 5 MG/ML INJ: 10.0000 mg | INTRAVENOUS | Status: DC | PRN

## 2019-03-29 MED ORDER — MIDAZOLAM HCL 2 MG/2ML IJ SOLN
INTRAMUSCULAR | Status: AC
  Filled 2019-03-29: qty 2

## 2019-03-29 MED ORDER — ACETAMINOPHEN 325 MG PO TABS
650.0000 mg | ORAL_TABLET | ORAL | Status: DC | PRN
  Administered 2019-03-29: 650 mg via ORAL
  Filled 2019-03-29: qty 2

## 2019-03-29 MED ORDER — BENZOCAINE-MENTHOL 20-0.5 % EX AERO: 1.0000 "application " | INHALATION_SPRAY | CUTANEOUS | Status: DC | PRN

## 2019-03-29 MED ORDER — OXYCODONE HCL 5 MG/5ML PO SOLN: 5.0000 mg | Freq: Once | ORAL | Status: DC | PRN

## 2019-03-29 MED ORDER — OXYCODONE-ACETAMINOPHEN 5-325 MG PO TABS: 2.0000 | ORAL_TABLET | ORAL | Status: DC | PRN

## 2019-03-29 MED ORDER — PRENATAL MULTIVITAMIN CH
1.0000 | ORAL_TABLET | Freq: Every day | ORAL | Status: DC
  Administered 2019-03-30: 1 via ORAL
  Filled 2019-03-29: qty 1

## 2019-03-29 MED ORDER — IBUPROFEN 600 MG PO TABS
600.0000 mg | ORAL_TABLET | Freq: Four times a day (QID) | ORAL | Status: DC
  Administered 2019-03-29 – 2019-03-31 (×6): 600 mg via ORAL
  Filled 2019-03-29 (×6): qty 1

## 2019-03-29 MED ORDER — SIMETHICONE 80 MG PO CHEW: 80.0000 mg | CHEWABLE_TABLET | ORAL | Status: DC | PRN

## 2019-03-29 MED ORDER — FENTANYL CITRATE (PF) 100 MCG/2ML IJ SOLN
INTRAMUSCULAR | Status: DC | PRN
  Administered 2019-03-29: 50 ug via EPIDURAL
  Administered 2019-03-29: 50 ug via INTRAVENOUS

## 2019-03-29 MED ORDER — SODIUM BICARBONATE 8.4 % IV SOLN
INTRAVENOUS | Status: DC | PRN
  Administered 2019-03-29: 5 mL via EPIDURAL
  Administered 2019-03-29: 7 mL via EPIDURAL
  Administered 2019-03-29: 5 mL via EPIDURAL
  Administered 2019-03-29: 3 mL via EPIDURAL

## 2019-03-29 MED ORDER — SODIUM CHLORIDE 0.9 % IV SOLN
2.0000 g | INTRAVENOUS | Status: AC
  Administered 2019-03-29: 2 g via INTRAVENOUS

## 2019-03-29 MED ORDER — FENTANYL CITRATE (PF) 100 MCG/2ML IJ SOLN: 50.0000 ug | INTRAMUSCULAR | Status: DC | PRN

## 2019-03-29 MED ORDER — FENTANYL CITRATE (PF) 100 MCG/2ML IJ SOLN
INTRAMUSCULAR | Status: AC
  Filled 2019-03-29: qty 2

## 2019-03-29 MED ORDER — OXYCODONE HCL 5 MG PO TABS: 5.0000 mg | ORAL_TABLET | ORAL | Status: DC | PRN

## 2019-03-29 MED ORDER — COCONUT OIL OIL: 1.0000 "application " | TOPICAL_OIL | Status: DC | PRN

## 2019-03-29 MED ORDER — ACETAMINOPHEN 325 MG PO TABS: 650.0000 mg | ORAL_TABLET | ORAL | Status: DC | PRN

## 2019-03-29 MED ORDER — LIDOCAINE HCL (PF) 1 % IJ SOLN
30.0000 mL | INTRAMUSCULAR | Status: DC | PRN
  Filled 2019-03-29: qty 30

## 2019-03-29 MED ORDER — LACTATED RINGERS IV SOLN
500.0000 mL | INTRAVENOUS | Status: DC | PRN
  Administered 2019-03-29: 300 mL via INTRAVENOUS

## 2019-03-29 MED ORDER — SENNOSIDES-DOCUSATE SODIUM 8.6-50 MG PO TABS
2.0000 | ORAL_TABLET | ORAL | Status: DC
  Administered 2019-03-29 – 2019-03-30 (×2): 2 via ORAL
  Filled 2019-03-29 (×2): qty 2

## 2019-03-29 MED ORDER — SOD CITRATE-CITRIC ACID 500-334 MG/5ML PO SOLN
30.0000 mL | ORAL | Status: DC | PRN
  Administered 2019-03-29: 30 mL via ORAL
  Filled 2019-03-29: qty 30

## 2019-03-29 MED ORDER — TERBUTALINE SULFATE 1 MG/ML IJ SOLN: 0.2500 mg | Freq: Once | INTRAMUSCULAR | Status: DC | PRN

## 2019-03-29 MED ORDER — OXYCODONE HCL 5 MG PO TABS: 5.0000 mg | ORAL_TABLET | Freq: Once | ORAL | Status: DC | PRN

## 2019-03-29 MED ORDER — LIDOCAINE HCL (PF) 1 % IJ SOLN
INTRAMUSCULAR | Status: DC | PRN
  Administered 2019-03-29 (×2): 5 mL via EPIDURAL

## 2019-03-29 MED ORDER — ONDANSETRON HCL 4 MG/2ML IJ SOLN: 4.0000 mg | Freq: Once | INTRAMUSCULAR | Status: DC | PRN

## 2019-03-29 MED ORDER — WITCH HAZEL-GLYCERIN EX PADS: 1.0000 "application " | MEDICATED_PAD | CUTANEOUS | Status: DC | PRN

## 2019-03-29 MED ORDER — HYDROXYZINE HCL 25 MG PO TABS
50.0000 mg | ORAL_TABLET | Freq: Four times a day (QID) | ORAL | Status: DC | PRN
  Filled 2019-03-29: qty 1

## 2019-03-29 MED ORDER — DIPHENHYDRAMINE HCL 50 MG/ML IJ SOLN: 12.5000 mg | INTRAMUSCULAR | Status: DC | PRN

## 2019-03-29 MED ORDER — ONDANSETRON HCL 4 MG/2ML IJ SOLN: 4.0000 mg | Freq: Four times a day (QID) | INTRAMUSCULAR | Status: DC | PRN

## 2019-03-29 MED ORDER — DIBUCAINE (PERIANAL) 1 % EX OINT: 1.0000 "application " | TOPICAL_OINTMENT | CUTANEOUS | Status: DC | PRN

## 2019-03-29 MED ORDER — ONDANSETRON HCL 4 MG PO TABS: 4.0000 mg | ORAL_TABLET | ORAL | Status: DC | PRN

## 2019-03-29 MED ORDER — LACTATED RINGERS IV SOLN
INTRAVENOUS | Status: DC
  Administered 2019-03-29 (×2): via INTRAVENOUS

## 2019-03-29 MED ORDER — FENTANYL CITRATE (PF) 100 MCG/2ML IJ SOLN: 25.0000 ug | INTRAMUSCULAR | Status: DC | PRN

## 2019-03-29 MED ORDER — OXYTOCIN BOLUS FROM INFUSION
500.0000 mL | Freq: Once | INTRAVENOUS | Status: AC
  Administered 2019-03-29: 500 mL/h via INTRAVENOUS

## 2019-03-29 MED ORDER — FENTANYL-BUPIVACAINE-NACL 0.5-0.125-0.9 MG/250ML-% EP SOLN
12.0000 mL/h | EPIDURAL | Status: DC | PRN
  Filled 2019-03-29: qty 250

## 2019-03-29 MED ORDER — BUPIVACAINE HCL (PF) 0.25 % IJ SOLN
INTRAMUSCULAR | Status: AC
  Filled 2019-03-29: qty 30

## 2019-03-29 MED ORDER — DIPHENHYDRAMINE HCL 25 MG PO CAPS: 25.0000 mg | ORAL_CAPSULE | Freq: Four times a day (QID) | ORAL | Status: DC | PRN

## 2019-03-29 MED ORDER — FLEET ENEMA 7-19 GM/118ML RE ENEM: 1.0000 | ENEMA | Freq: Every day | RECTAL | Status: DC | PRN

## 2019-03-29 MED ORDER — BUPIVACAINE HCL (PF) 0.25 % IJ SOLN
INTRAMUSCULAR | Status: DC | PRN
  Administered 2019-03-29: 30 mL

## 2019-03-29 MED ORDER — ONDANSETRON HCL 4 MG/2ML IJ SOLN: 4.0000 mg | INTRAMUSCULAR | Status: DC | PRN

## 2019-03-29 MED ORDER — SODIUM CHLORIDE 0.9 % IV SOLN
1.0000 g | Freq: Four times a day (QID) | INTRAVENOUS | Status: DC
  Administered 2019-03-29: 1 g via INTRAVENOUS
  Filled 2019-03-29 (×2): qty 1000

## 2019-03-29 MED ORDER — LACTATED RINGERS IV SOLN
500.0000 mL | Freq: Once | INTRAVENOUS | Status: AC
  Administered 2019-03-29: 500 mL via INTRAVENOUS

## 2019-03-29 MED ORDER — SODIUM CHLORIDE (PF) 0.9 % IJ SOLN
INTRAMUSCULAR | Status: DC | PRN
  Administered 2019-03-29: 12 mL/h via EPIDURAL

## 2019-03-29 MED ORDER — OXYCODONE-ACETAMINOPHEN 5-325 MG PO TABS: 1.0000 | ORAL_TABLET | ORAL | Status: DC | PRN

## 2019-03-29 MED ORDER — OXYTOCIN 40 UNITS IN NORMAL SALINE INFUSION - SIMPLE MED
2.5000 [IU]/h | INTRAVENOUS | Status: DC
  Administered 2019-03-29: 18:00:00 200 mL via INTRAVENOUS
  Filled 2019-03-29: qty 1000

## 2019-03-29 MED ORDER — MIDAZOLAM HCL 5 MG/5ML IJ SOLN
INTRAMUSCULAR | Status: DC | PRN
  Administered 2019-03-29 (×2): 1 mg via INTRAVENOUS

## 2019-03-29 MED ORDER — PHENYLEPHRINE 40 MCG/ML (10ML) SYRINGE FOR IV PUSH (FOR BLOOD PRESSURE SUPPORT)
80.0000 ug | PREFILLED_SYRINGE | INTRAVENOUS | Status: DC | PRN
  Filled 2019-03-29: qty 10

## 2019-03-29 MED ORDER — ZOLPIDEM TARTRATE 5 MG PO TABS: 5.0000 mg | ORAL_TABLET | Freq: Every evening | ORAL | Status: DC | PRN

## 2019-03-29 MED ORDER — SODIUM CHLORIDE 0.9 % IV SOLN
2.0000 g | Freq: Four times a day (QID) | INTRAVENOUS | Status: DC
  Administered 2019-03-29: 2 g via INTRAVENOUS
  Filled 2019-03-29 (×3): qty 2000

## 2019-03-29 MED ORDER — TETANUS-DIPHTH-ACELL PERTUSSIS 5-2.5-18.5 LF-MCG/0.5 IM SUSP: 0.5000 mL | Freq: Once | INTRAMUSCULAR | Status: DC

## 2019-03-29 SURGICAL SUPPLY — 22 items
BLADE SURG 11 STRL SS (BLADE) ×3 IMPLANT
CLIP FILSHIE TUBAL LIGA STRL (Clip) ×3 IMPLANT
CLOTH BEACON ORANGE TIMEOUT ST (SAFETY) ×3 IMPLANT
DRSG OPSITE POSTOP 3X4 (GAUZE/BANDAGES/DRESSINGS) ×3 IMPLANT
DURAPREP 26ML APPLICATOR (WOUND CARE) ×3 IMPLANT
GLOVE BIO SURGEON STRL SZ 6.5 (GLOVE) ×2 IMPLANT
GLOVE BIO SURGEONS STRL SZ 6.5 (GLOVE) ×1
GLOVE BIOGEL PI IND STRL 7.0 (GLOVE) ×2 IMPLANT
GLOVE BIOGEL PI INDICATOR 7.0 (GLOVE) ×4
GOWN STRL REUS W/TWL LRG LVL3 (GOWN DISPOSABLE) ×6 IMPLANT
NEEDLE HYPO 22GX1.5 SAFETY (NEEDLE) ×3 IMPLANT
NS IRRIG 1000ML POUR BTL (IV SOLUTION) ×3 IMPLANT
PACK ABDOMINAL MINOR (CUSTOM PROCEDURE TRAY) ×3 IMPLANT
PROTECTOR NERVE ULNAR (MISCELLANEOUS) ×3 IMPLANT
SPONGE LAP 4X18 RFD (DISPOSABLE) IMPLANT
SUT VIC AB 0 CT1 27 (SUTURE) ×2
SUT VIC AB 0 CT1 27XBRD ANBCTR (SUTURE) ×1 IMPLANT
SUT VICRYL 4-0 PS2 18IN ABS (SUTURE) ×3 IMPLANT
SYR CONTROL 10ML LL (SYRINGE) ×3 IMPLANT
TOWEL OR 17X24 6PK STRL BLUE (TOWEL DISPOSABLE) ×6 IMPLANT
TRAY FOLEY CATH SILVER 16FR (SET/KITS/TRAYS/PACK) ×3 IMPLANT
WATER STERILE IRR 1000ML POUR (IV SOLUTION) ×3 IMPLANT

## 2019-03-29 NOTE — Progress Notes (Signed)
OB/GYN Faculty Practice: Labor Progress Note  Subjective: Into room because patient with prolonged deceleration. Comfortable with epidural.  Objective: BP (!) 110/58   Pulse 77   Temp 98.7 F (37.1 C) (Oral)   Resp 20   Ht 5\' 4"  (1.626 m)   Wt 104.4 kg   LMP 07/15/2018 (Approximate)   SpO2 97%   BMI 39.49 kg/m  Gen: well-appearing, NAD Dilation: 10 Dilation Complete Date: 03/29/19 Dilation Complete Time: 1448 Effacement (%): 100 Cervical Position: Middle Station: 0 Presentation: Vertex Exam by:: Ashad Fawbush  Assessment and Plan: 33 y.o. [id number] [redacted]w[redacted]d here for SOL.  Labor: Admitted earlier this morning with SOL, SROM. Able to reduce anterior lip, will start pushing.  -- pain control: epidural in place -- PPH Risk: low  Fetal Well-Being:Cephalic by sutures.  -- Category II - continuous fetal monitoring - improved with AROM of forebag and scalp stimulation  -- GBS positive - has received 2 doses of AMP   Kianna Billet S. Juleen China, DO OB/GYN Fellow, Faculty Practice  2:49 PM

## 2019-03-29 NOTE — Anesthesia Preprocedure Evaluation (Signed)
Anesthesia Evaluation  Patient identified by MRN, date of birth, ID band Patient awake    Reviewed: Allergy & Precautions, H&P , NPO status , Patient's Chart, lab work & pertinent test results  History of Anesthesia Complications Negative for: history of anesthetic complications  Airway Mallampati: II  TM Distance: >3 FB Neck ROM: full    Dental no notable dental hx.    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 Per patient report she saw GI in the past and they did not recommend treatment. She has never been diagnosed with cirrhosis and she has no history of issues with bleeding. LFTs are normal today. Plts 200.  Reproductive/Obstetrics (+) Pregnancy                             Anesthesia Physical Anesthesia Plan  ASA: III  Anesthesia Plan: Epidural   Post-op Pain Management:    Induction:   PONV Risk Score and Plan:   Airway Management Planned:   Additional Equipment:   Intra-op Plan:   Post-operative Plan:   Informed Consent: I have reviewed the patients History and Physical, chart, labs and discussed the procedure including the risks, benefits and alternatives for the proposed anesthesia with the patient or authorized representative who has indicated his/her understanding and acceptance.        Plan Discussed with:   Anesthesia Plan Comments:         Anesthesia Quick Evaluation

## 2019-03-29 NOTE — Lactation Note (Addendum)
This note was copied from a baby's chart. Lactation Consultation Note  Patient Name: Kara [name] Date: 03/29/2019 Reason for consult: Term;Initial assessment P4, 7 hour female infant, Mom with GDM in pregnancy. This is mom's third time breastfeeding infant. Mom feels breastfeeding is going well. Mom attempted in L&D but infant latched when mom in room with assistance from Nurse. Mom feels breastfeeding is going well, this is her last baby and she wants to breast feed for one year. Mom stopped breastfeed only after a few days with other three children.  Mom latched infant on left breast using cross cradle hold, LC notice infant's sometimes sliding to mom's tip, LC asked mom hold infant close to breast with nose and chin touching. Few swallows observed, infant breastfeed sustaining latch for 10 minutes. Mom's nipples were well  rounded when infant latched  off  the breast. Mom knows how to hand express and infant was given 3 ml of colostrum by spoon after feeding at breast. Mom doesn't have breast pump at home, [facility] gave mom harmony hand pump and Mom  was shown how to use hand pump  & how to disassemble, clean, & reassemble parts. Mom knows to call Nurse or Kara [name] if she has any questions, concerns or need assistance with latching infant to breast. Mom knows to breastfeed according hunger cues, 8 to 12 times within 24 hours and on demand. Reviewed Baby & Me book's Breastfeeding Basics.   Mom made aware of O/P services, breastfeeding support groups, community resources, and our phone # for post-discharge questions.  Mom's plan: 1. Breastfeed according hunger cues, 8 to 12 times within 24 hours and on demand. 2. Mom plans to hand express after breastfeeding and give infant extra volume by spoon. 3, Mom plans to do as much STS as possible.  Maternal Data Formula Feeding for Exclusion: No Has patient been taught Hand Expression?: Yes(Infant given 3 ml of colostrum by spoon.) Does the  patient have breastfeeding experience prior to this delivery?: Yes  Feeding Feeding Type: Breast Fed  LATCH Score Latch: Grasps breast easily, tongue down, lips flanged, rhythmical sucking.  Audible Swallowing: Spontaneous and intermittent  Type of Nipple: Everted at rest and after stimulation  Comfort (Breast/Nipple): Soft / non-tender  Hold (Positioning): Assistance needed to correctly position infant at breast and maintain latch.  LATCH Score: 9  Interventions Interventions: Breast feeding basics reviewed;Breast compression;Assisted with latch;Adjust position;Hand pump;Skin to skin;Support pillows;Breast massage;Position options;Hand express;Expressed milk  Lactation Tools Discussed/Used Tools: Pump Breast pump type: Manual WIC Program: No Pump Review: Setup, frequency, and cleaning;Milk Storage Initiated by:: Kara [name] Date initiated:: 03/30/19   Consult Status Consult Status: Follow-up Date: 03/30/19 Follow-up type: In-patient    Kara [name] 03/29/2019, 11:01 PM

## 2019-03-29 NOTE — Progress Notes (Signed)
LABOR PROGRESS NOTE  Kara [name] is a 33 y.o. [id number] at [redacted]w[redacted]d  admitted for SOL.  Subjective: She is doing resting in bed lying on her right side and is not experiencing any discomfort at this time.  Objective: BP (!) 96/53   Pulse 85   Temp 98.2 F (36.8 C) (Oral)   Resp 18   Ht 5\' 4"  (1.626 m)   Wt 104.4 kg   LMP 07/15/2018 (Approximate)   SpO2 97%   BMI 39.49 kg/m  or  Vitals:   03/29/19 0901 03/29/19 0932 03/29/19 0940 03/29/19 1001  BP: (!) 107/53 (!) 85/47 (!) 89/42 (!) 96/53  Pulse: 85 76 74 85  Resp: 18 20 20 18   Temp: 98.2 F (36.8 C)     TempSrc: Oral     SpO2:      Weight:      Height:         Dilation: 6 Effacement (%): 100 Cervical Position: Posterior Station: -1 Presentation: Vertex Exam by:: Foley,rn FHT: baseline rate 120bpm, moderate varibility, 10 x 10 acel, no decel Toco: minimal  Labs: Lab Results  Component Value Date   WBC 16.8 (H) 03/29/2019   HGB 12.2 03/29/2019   HCT 36.2 03/29/2019   MCV 94.8 03/29/2019   PLT 200 03/29/2019    Patient Active Problem List   Diagnosis Date Noted  . Indication for care in labor or delivery 03/29/2019  . Chronic hepatitis C affecting pregnancy, antepartum ([place]) 03/29/2019  . Diet controlled gestational diabetes mellitus (GDM) in third trimester 03/29/2019  . Rubella non-immune status, antepartum 03/11/2019  . Supervision of other high risk pregnancy, antepartum 02/25/2019  . History of heroin abuse ([place]) 12/25/2011  . Hepatitis C 10/01/2011  . Suboxone maintenance treatment complicating pregnancy, antepartum ([place]) 09/27/2011  . Rh negative, antepartum 09/27/2011    Assessment / Plan: 33 y.o. T6L4650 at [redacted]w[redacted]d here for SOL.  Labor: Expectant management. Continue cervical exams for the progression of labor. Fetal Wellbeing: Cat I  Pain Control:  Epidural Anticipated MOD:  Vaginal  Mikhala Kenan Autry-Lott, D.O. Family Medicine Resident, PGY-1 03/29/2019, 10:16 AM

## 2019-03-29 NOTE — Op Note (Signed)
Kara [name] 03/29/2019  PREOPERATIVE DIAGNOSIS:  Undesired fertility POSTOPERATIVE DIAGNOSIS:  Undesired fertility PROCEDURE:  Postpartum Bilateral Tubal Sterilization using Filshie Clips   SURGEON: Surgeon(s) and Role:    * Woodroe Mode, MD - Primary    * Glenice Bow, DO - Attending          Phill Myron, DO- OB Fellow  ANESTHESIA:  Epidural COMPLICATIONS:  None immediate. ESTIMATED BLOOD LOSS:  10 mL  INDICATIONS: 33 y.o. yo [id number]  with undesired fertility,status post vaginal delivery, desires permanent sterilization. Risks and benefits of procedure discussed with patient including permanence of method, bleeding, infection, injury to surrounding organs and need for additional procedures. Risk failure of 0.5-1% with increased risk of ectopic gestation if pregnancy occurs was also discussed with patient.   FINDINGS:  Normal uterus, tubes, and ovaries.  TECHNIQUE:  The patient was taken to the operating room where her epidural anesthesia was dosed up to surgical level and found to be adequate.  She was then placed in the dorsal supine position and prepped and draped in sterile fashion.  After an adequate timeout was performed, attention was turned to the patient's abdomen where 0.25% local analgesia was injected along the incision line, and a small transverse skin incision was made under the umbilical fold. The incision was taken down to the layer of fascia using the scalpel, and fascia was incised, and extended bilaterally using Mayo scissors. The peritoneum was entered in a sharp fashion. Attention was then turned to the patient's uterus, and right fallopian tube was identified and followed out to the fimbriated end.  A Filshie clip was placed on the right fallopian tube about 2 cm from the cornual attachment, with care given to incorporate the underlying mesosalpinx.  A similar process was carried out on the left side allowing for bilateral tubal sterilization.  Good  hemostasis was noted overall. The instruments were then removed from the patient's abdomen and the fascial incision was repaired with 0 Vicryl, and the skin was closed with a 3-0 Monocryl subcuticular stitch. The patient tolerated the procedure well.  Sponge, lap, and needle counts were correct times two.  The patient was then taken to the recovery room awake and in stable condition.   [name], DO OB/GYN Fellow

## 2019-03-29 NOTE — H&P (Addendum)
OBSTETRIC ADMISSION HISTORY AND PHYSICAL  Kara [name] is a 33 y.o. female [phone] with IUP at 59w2dby 338week u/s presenting for labor.   Reports fetal movement. Denies vaginal bleeding. SROM reported 0000 (8/3). Feelings contractions.  She received her prenatal care at [facility].  Support person in labor: FOB Raeshaun  Ultrasounds . Anatomy U/S: 03/11/2019  Impression  G6 P3. Insufficient prenatal care. Obstetric history is  significant for 3 previous term vaginal deliveries. Patient does  have gestational diabetes. She has chronic hepatitis C  infection. She is on suboxone maintenance.  Fetal biometry is consistent with 36w 5d gestation. Amniotic  fluid is normal and good fetal activity is seen. Fetal anatomy  appears normal, but limited by advanced gestational age.  Cephalic presentation.  We have assigned her EDD at 04/03/2019, which is  suboptimally-dated because of late gestation.   Prenatal History/Complications: GBS positive  suboxone during pregnancy +hep C (undetectable viral load) A1DM (failed 2hr GTT 7/30) Late PNC, poor dating Rh neg Rubella non-imm Obesity (BMI 39)   Past Medical History: Past Medical History:  Diagnosis Date  . Hepatitis C    Dx 2009  . Infection   . Preterm labor   . STD (female)   . Vaginal Pap smear, abnormal    cryo therapy 2008    Past Surgical History: Past Surgical History:  Procedure Laterality Date  . extraction of wisdome teeth     age 33   Obstetrical History: OB History    Gravida  6   Para  3   Term  2   Preterm  1   AB  2   Living  3     SAB  0   TAB  2   Ectopic  0   Multiple  0   Live Births  3           Social History: Social History   Socioeconomic History  . Marital status: Single    Spouse name: Not on file  . Number of children: Not on file  . Years of education: Not on file  . Highest education level: Not on file  Occupational History  . Not on file  Social Needs   . Financial resource strain: Not on file  . Food insecurity    Worry: Not on file    Inability: Not on file  . Transportation needs    Medical: Not on file    Non-medical: Not on file  Tobacco Use  . Smoking status: Former Smoker    Packs/day: 0.50    Years: 4.00    Pack years: 2.00    Types: Cigarettes    Quit date: 02/09/2019    Years since quitting: 0.1  . Smokeless tobacco: Never Used  Substance and Sexual Activity  . Alcohol use: Not Currently  . Drug use: Not Currently    Types: Marijuana, Other-see comments    Comment: suboxone  . Sexual activity: Yes    Birth control/protection: None  Lifestyle  . Physical activity    Days per week: Not on file    Minutes per session: Not on file  . Stress: Not on file  Relationships  . Social [place] phone: Not on file    Gets together: Not on file    Attends religious service: Not on file    Active member of club or organization: Not on file    Attends meetings of clubs or organizations: Not  on file    Relationship status: Not on file  Other Topics Concern  . Not on file  Social History Narrative  . Not on file    Family History: Family History  Adopted: Yes    Allergies: No Known Allergies  Medications Prior to Admission  Medication Sig Dispense Refill Last Dose  . Buprenorphine HCl-Naloxone HCl (SUBOXONE) 8-2 MG FILM Place 1-2 Film under the tongue 3 (three) times daily. Patient states she takes 1 to 2 filmstrip SL three times daily (morning, lunch, and dinner).  She decides on dose based on symptoms.     Riley Nearing w/o A Vit-FeFum-FePo-FA (CONCEPT OB) 130-92.4-1 MG CAPS Take 1 tablet by mouth daily. 30 capsule 12   . sertraline (ZOLOFT) 50 MG tablet Take 1 tablet (50 mg total) by mouth daily. Take 1/2 tablet daily x 1 week, then 1 tablet daily 30 tablet 6      Review of Systems  All systems reviewed and negative except as stated in HPI  Blood pressure 134/73, pulse 73, temperature 98.1 F (36.7  C), temperature source Oral, resp. rate 16, height _0  (1.626 m), weight 104.4 kg, last menstrual period 07/15/2018, SpO2 100 %, unknown if currently breastfeeding. General appearance: alert, cooperative and no distress Lungs: no respiratory distress Heart: regular rate  Abdomen: soft, non-tender; gravid b Pelvic: deferred Extremities: Homans sign is negative, no sign of DVT Presentation: cephalic Fetal monitoring: baseline 150 bpm, mod variability, +accels, no decels Uterine activity: q5 minutes Dilation: 4 Effacement (%): 80 Station: -2 Exam by:: Esau Grew RN  Prenatal labs: ABO, Rh: B/Negative/-- (07/02 1507) Antibody: Negative (07/02 1507) Rubella: <0.90 (07/02 1507) RPR: Non Reactive (07/30 1012)  HBsAg: Negative (07/02 1507)  HIV: Non Reactive (07/30 1012)  GBS:   positive Glucola: failed 2hr GTT Genetic screening:  unremarkable  Prenatal Transfer Tool  Maternal Diabetes: Yes:  Diabetes Type:  Diet controlled Genetic Screening: Normal Maternal Ultrasounds/Referrals: Normal Fetal Ultrasounds or other Referrals:  None Maternal Substance Abuse:  Yes:  Type: Other: suboxone Significant Maternal Medications:  Meds include: Other:  Significant Maternal Lab Results: Group B Strep positive and Other: rubella non-immune, hep c ab +, undetectable viral load  No results found for this or any previous visit (from the past 24 hour(s)).  Patient Active Problem List   Diagnosis Date Noted  . Indication for care in labor or delivery 03/29/2019  . Rubella non-immune status, antepartum 03/11/2019  . Supervision of other high risk pregnancy, antepartum 02/25/2019  . History of heroin abuse ([place]) 12/25/2011  . Hepatitis C 10/01/2011  . Suboxone maintenance treatment complicating pregnancy, antepartum ([place]) 09/27/2011    Assessment/Plan:  Kara [name] is a 33 y.o. V4M0867 at 64w2dhere for labor.  Labor: confirmed SROM _1 , feeling contractions, will continue time  appropriate cervical exams and augment as necessary. -- pain control: pt desires epidural -- Will monitor CBGs q4 in labor  Rh neg --Blood type: B neg, ab neg --Received Rh Ig 03/25/2019 --Needs Rh Ig post partum  Rubella NI --02/25/2019 --MMR post partum  Hep C Ab positive --02/25/19 --viral load undetectable 03/25/19 --never undergone treatment  Prior drug use --Endorses prior heroin and narcotic use --has been stable on subutex for a long time --1-2 film TID  Fetal Wellbeing: EFW 3125g (66%ile) by 36w u/s.  -- GBS (positive)- ampicillin ordered -- continuous fetal monitoring - cat 1   Postpartum Planning -- breast -- Rubella non-imm/Tdap 03/25/2019 --BTL, papers signed  ACorliss Blacker PGY-III  Family Medicine   I confirm that I have verified the information documented in the resident's note and that I have also personally reperformed the history, physical exam and all medical decision making activities of this service and have verified that all service and findings are accurately documented in this student's note.    Wende Mott, [place] 03/29/2019 7:42 AM

## 2019-03-29 NOTE — Anesthesia Procedure Notes (Signed)
Epidural Patient location during procedure: OB Start time: 03/29/2019 7:54 AM End time: 03/29/2019 8:07 AM  Staffing Anesthesiologist: Lidia Collum, MD Performed: anesthesiologist   Preanesthetic Checklist Completed: patient identified, pre-op evaluation, timeout performed, IV checked, risks and benefits discussed and monitors and equipment checked  Epidural Patient position: sitting Prep: DuraPrep Patient monitoring: heart rate, continuous pulse ox and blood pressure Approach: midline Location: L3-L4 Injection technique: LOR air  Needle:  Needle type: Tuohy  Needle gauge: 17 G Needle length: 9 cm Needle insertion depth: 6 cm Catheter type: closed end flexible Catheter size: 19 Gauge Catheter at skin depth: 11 cm Test dose: negative  Assessment Events: blood not aspirated, injection not painful, no injection resistance, negative IV test and no paresthesia  Additional Notes Reason for block:procedure for pain

## 2019-03-29 NOTE — Addendum Note (Signed)
Addended by: Truett Mainland on: 03/29/2019 02:17 PM   Modules accepted: Orders

## 2019-03-29 NOTE — Telephone Encounter (Signed)
-----  Message from Truett Mainland, DO sent at 03/29/2019  2:16 PM EDT ----- She has gestational diabetes. Testing kit prescribed. Can we get her into diabetes education this week?

## 2019-03-29 NOTE — MAU Note (Signed)
Presents with regular cxts (unable to time) since approx 0000 today with moderate amount of watery discharge. No vag bldg. Pos FM  Gilmer Mor RN

## 2019-03-29 NOTE — Anesthesia Preprocedure Evaluation (Signed)
Anesthesia Evaluation  Patient identified by MRN, date of birth, ID band Patient awake    Reviewed: Allergy & Precautions, H&P , NPO status , Patient's Chart, lab work & pertinent test results  History of Anesthesia Complications Negative for: history of anesthetic complications  Airway Mallampati: II  TM Distance: >3 FB Neck ROM: full    Dental no notable dental hx.    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 Per patient report she saw GI in the past and they did not recommend treatment. She has never been diagnosed with cirrhosis and she has no history of issues with bleeding. LFTs are normal today. Plts 200.  Reproductive/Obstetrics (+) Pregnancy                             Anesthesia Physical  Anesthesia Plan  ASA: III  Anesthesia Plan: Epidural   Post-op Pain Management:    Induction:   PONV Risk Score and Plan: 3 and Treatment may vary due to age or medical condition  Airway Management Planned:   Additional Equipment:   Intra-op Plan:   Post-operative Plan:   Informed Consent: I have reviewed the patients History and Physical, chart, labs and discussed the procedure including the risks, benefits and alternatives for the proposed anesthesia with the patient or authorized representative who  has indicated his/her understanding and acceptance.       Plan Discussed with:   Anesthesia Plan Comments:         Anesthesia Quick Evaluation

## 2019-03-29 NOTE — Telephone Encounter (Signed)
Called pt regarding 2 hr GTT results. Left message for pt to call the office back. Diabetes Education is scheduled for Wednesday 03/31/19 at 3:15pm. Pt will be notified.  chiquita l wilson, CMA

## 2019-03-29 NOTE — Progress Notes (Signed)
33 y.o. F5O3606 with undesired fertility s/p vaginal delivery desires permanent sterilization. Risks and benefits of postpartum tubal sterilization procedure was discussed with the patient including permanence of method, bleeding, infection, injury to surrounding organs, anesthesia and need for additional procedures. Risk failure of 0.5-1% with increased risk of ectopic gestation if pregnancy occurs was also discussed with patient. Patient verbalized understanding and all questions were answered.  Alvina Filbert Roselie Awkward MD 03/29/2019  Patient ID: Kara [name], female   DOB: 29-Mar-1986, 33 y.o.   MRN: [id number]

## 2019-03-29 NOTE — Discharge Summary (Signed)
Obstetrics Discharge Summary [facility]N [facility]   Patient Name: Kara [name] DOB: 11-28-1985 MRN: [id number]  Date of admission: 03/29/2019 Delivering MD: Gerlene Fee   Date of discharge: 03/31/2019  Admitting diagnosis: 3 WKS, CTX Intrauterine pregnancy: [redacted]w[redacted]d    Secondary diagnosis:   Principal Problem:   Indication for care in labor or delivery Active Problems:   Suboxone maintenance treatment complicating pregnancy, antepartum ([facility])   Rh negative, antepartum   History of heroin abuse ([place]   Rubella non-immune status, antepartum   Chronic hepatitis C affecting pregnancy, antepartum ([place]   Diet controlled gestational diabetes mellitus (GDM) in third trimester   Vaginal delivery   Discharge diagnosis: Term Pregnancy Delivered                                            Postpartum procedures: None  Complications: none  Outpatient Follow-Up: '[ ]'  2-hr GTT  Hospital course: Kara MCKOYis a 33y.o. 367w2dho was admitted for spontaneous onset of labor. Her pregnancy was complicated by above noted. Her labor course was notable for admission in active labor, epidural placement, augmentation with pitocin when pushing. Delivery was uncomplicated. She had a postpartum tubal ligation immediately after delivery. Please see delivery/op note for additional details. Her postpartum course was uncomplicated. She was breastfeeding without difficulty. By day of discharge, she was passing flatus, urinating, eating and drinking without difficulty. Her pain was well-controlled, and she was discharged home with baby. She will follow-up in clinic in 4 weeks.   Physical exam  Vitals:   03/30/19 1100 03/30/19 1415 03/30/19 2233 03/31/19 0745  BP: 119/67 112/64 116/69 114/72  Pulse: 80 60 66 65  Resp: '18 16 18 19  ' Temp: 98.1 F (36.7 C) 98.1 F (36.7 C) 98.1 F (36.7 C) 98.3 F (36.8 C)  TempSrc: Oral Oral Oral Oral  SpO2: 99%   100%  Weight:      Height:       General:  Alert and oriented, no distress Lochia: appropriate Uterine Fundus: firm Incision: n/a DVT Evaluation: No evidence of DVT seen on physical exam.  Labs: Lab Results  Component Value Date   WBC 14.9 (H) 03/30/2019   HGB 11.0 (L) 03/30/2019   HCT 32.9 (L) 03/30/2019   MCV 95.1 03/30/2019   PLT 205 03/30/2019   CMP Latest Ref Rng & Units 03/29/2019  Glucose 70 - 99 mg/dL 104(H)  BUN 6 - 20 mg/dL 6  Creatinine 0.44 - 1.00 mg/dL 0.52  Sodium 135 - 145 mmol/L 135  Potassium 3.5 - 5.1 mmol/L 3.8  Chloride 98 - 111 mmol/L 105  CO2 22 - 32 mmol/L 20(L)  Calcium 8.9 - 10.3 mg/dL 8.6(L)  Total Protein 6.5 - 8.1 g/dL 5.7(L)  Total Bilirubin 0.3 - 1.2 mg/dL 0.6  Alkaline Phos 38 - 126 U/L 152(H)  AST 15 - 41 U/L 13(L)  ALT 0 - 44 U/L 9    Discharge instructions: Per After Visit Summary and "Baby and Me Booklet"  After visit meds:  Allergies as of 03/31/2019   No Known Allergies     Medication List    TAKE these medications   Accu-Chek Nano SmartView w/Device Kit 1 kit by Subdermal route as directed. Check blood sugars for fasting, and two hours after breakfast, lunch and dinner (4 checks daily)   Concept OB 130-92.4-1 MG Caps Take  1 tablet by mouth daily.   ibuprofen 600 MG tablet Commonly known as: ADVIL Take 1 tablet (600 mg total) by mouth every 6 (six) hours.   sertraline 50 MG tablet Commonly known as: Zoloft Take 1 tablet (50 mg total) by mouth daily. Take 1/2 tablet daily x 1 week, then 1 tablet daily   Suboxone 8-2 MG Film Generic drug: Buprenorphine HCl-Naloxone HCl Place 1-2 Film under the tongue 3 (three) times daily. Patient states she takes 1 to 2 filmstrip SL three times daily (morning, lunch, and dinner).  She decides on dose based on symptoms.       Postpartum contraception: Tubal Ligation Diet: Routine Diet Activity: Advance as tolerated. Pelvic rest for 6 weeks.   Follow-up Appt: Future Appointments  Date Time Provider [place]  05/07/2019   8:30 AM Truett Mainland, DO [facility] None   Follow-up Visit:No follow-ups on file.  Please schedule this patient for Postpartum visit in: 4 weeks with the following provider: Any provider High risk pregnancy complicated by: hep C, GBS pos, A1GDM, suboxone Delivery mode:  SVD Anticipated Birth Control:  BTL done PP PP Procedures needed: 2-hr GTT  Schedule Integrated BH visit: no  Newborn Data: Live born female    Newborn Delivery   Birth date/time: 03/29/2019 15:34:00 Delivery type:      Baby Feeding: Bottle and Breast Disposition:home with mother

## 2019-03-29 NOTE — Discharge Instructions (Signed)

## 2019-03-30 ENCOUNTER — Encounter (HOSPITAL_COMMUNITY): Payer: Self-pay | Admitting: Obstetrics & Gynecology

## 2019-03-30 LAB — CBC
HCT: 32.9 % — ABNORMAL LOW (ref 36.0–46.0)
Hemoglobin: 11 g/dL — ABNORMAL LOW (ref 12.0–15.0)
MCH: 31.8 pg (ref 26.0–34.0)
MCHC: 33.4 g/dL (ref 30.0–36.0)
MCV: 95.1 fL (ref 80.0–100.0)
Platelets: 205 10*3/uL (ref 150–400)
RBC: 3.46 MIL/uL — ABNORMAL LOW (ref 3.87–5.11)
RDW: 13.2 % (ref 11.5–15.5)
WBC: 14.9 10*3/uL — ABNORMAL HIGH (ref 4.0–10.5)
nRBC: 0 % (ref 0.0–0.2)

## 2019-03-30 MED ORDER — NON FORMULARY: 8.0000 mg | Freq: Three times a day (TID) | Status: DC

## 2019-03-30 MED ORDER — RHO D IMMUNE GLOBULIN 1500 UNIT/2ML IJ SOSY
300.0000 ug | PREFILLED_SYRINGE | Freq: Once | INTRAMUSCULAR | Status: AC
  Administered 2019-03-30: 300 ug via INTRAVENOUS
  Filled 2019-03-30: qty 2

## 2019-03-30 MED ORDER — BUPRENORPHINE HCL 8 MG SL SUBL
8.0000 mg | SUBLINGUAL_TABLET | Freq: Three times a day (TID) | SUBLINGUAL | Status: DC
  Administered 2019-03-30 – 2019-03-31 (×4): 8 mg via SUBLINGUAL
  Filled 2019-03-30 (×4): qty 1

## 2019-03-30 NOTE — Lactation Note (Signed)
This note was copied from a baby's chart. Lactation Consultation Note  Patient Name: Kara [name] WGNFA'O Date: 03/30/2019 Reason for consult: Follow-up assessment;Other (Comment);Term(suboxone) Baby is 19 hours/2% weight loss.  Mom feels breastfeeding is going well but would like a feeding assist later today.  Baby recently fed for 15 minutes and is sleeping in mom's arms.  Instructed to watch for cues and call for assist when baby ready to feed.  Maternal Data    Feeding Feeding Type: Breast Fed  LATCH Score                   Interventions    Lactation Tools Discussed/Used     Consult Status Consult Status: Follow-up Date: 03/31/19 Follow-up type: In-patient    Ave Filter 03/30/2019, 11:28 AM

## 2019-03-30 NOTE — Progress Notes (Signed)
MMR VIS given to patient. 

## 2019-03-30 NOTE — Transfer of Care (Signed)
Immediate Anesthesia Transfer of Care Note  Patient: Kara [name]  Procedure(s) Performed: POST PARTUM TUBAL LIGATION (N/A )  Patient Location: PACU  Anesthesia Type:Epidural  Level of Consciousness: awake, alert  and oriented  Airway & Oxygen Therapy: Patient Spontanous Breathing  Post-op Assessment: Report given to RN and Post -op Vital signs reviewed and stable  Post vital signs: Reviewed and stable  Last Vitals:  Vitals Value Taken Time  BP 97/63 03/30/19 0800  Temp 36.7 C 03/30/19 0800  Pulse 72 03/30/19 0800  Resp 20 03/30/19 0800  SpO2 99 % 03/30/19 0800    Last Pain:  Vitals:   03/30/19 0800  TempSrc: Oral  PainSc: 2          Complications: No apparent anesthesia complications

## 2019-03-30 NOTE — Clinical Social Work Maternal (Signed)
CLINICAL SOCIAL WORK MATERNAL/CHILD NOTE  Patient Details  Name: Kara [name] MRN: [id number] Date of Birth: 06/19/1986  Date:  03/30/2019  Clinical Social Worker Initiating Note:  Ecko Beasley Date/Time: Initiated:  03/30/19/0904     Child's Name:  Kara [name]   Biological Parents:  Mother, Father(Kara [name] and Kara [name] DOB: 08/10/1987)   Need for Interpreter:  None   Reason for Referral:  Current Substance Use/Substance Use During Pregnancy , Late or No Prenatal Care    Address:  [address] [place] [address]    Phone number:  [phone] (home)     Additional phone number:   Household Members/Support Persons (HM/SP):   Household Member/Support Person 1, Household Member/Support Person 2, Household Member/Support Person 3, Household Member/Support Person 4, Household Member/Support Person 5, Household Member/Support Person 6   HM/SP Name Relationship DOB or Age  HM/SP -1 Kara [name] Daughter 05/05/2009  HM/SP -2 Kara [name] Daughter 12/26/2011  HM/SP -3 Kara [name] Daughter 04/04/2014  HM/SP -4   Mother    HM/SP -5   Father    HM/SP -6 Kara [name] FOB 08/10/1987  HM/SP -7        HM/SP -8          Natural Supports (not living in the home):  Extended Family, Immediate Family   Professional Supports: None   Employment: Full-time   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 - [employer]   Education:  Some College   Homebound arranged:    Financial Resources:  Medicaid   Other Resources:  Food Stamps    Cultural/Religious Considerations Which May Impact Care:    Strengths:  Ability to meet basic needs , Home prepared for child , Pediatrician chosen   Psychotropic Medications:         Pediatrician:    [place] area  Pediatrician List:   [facility]  [place]    [place]    [place]    [place]    [place]      Pediatrician Fax Number:    Risk Factors/Current  Problems:      Cognitive State:  Able to Concentrate , Alert , Linear Thinking    Mood/Affect:  Bright , Calm , Comfortable , Interested , Relaxed    CSW Assessment:  CSW received consult for late prenatal care and for MOB being on Suboxone during pregnancy. CSW also aware that infant's UDS came back positive for THC. CSW met with MOB to offer support and complete assessment.    MOB resting in bed holding infant, when CSW entered the room. FOB also present and resting on the couch under a blanket. CSW introduced self and received verbal permission from MOB to complete assessment with FOB present and MOB gave CSW permission to discuss anything in front of FOB. CSW explained reason for consult to which MOB expressed understanding. MOB very pleasant and engaged throughout assessment and was welcoming of visit. MOB appeared to be well-bonded with infant and was appropriate and attentive to infant during visit. FOB did not acknowledge CSW during visit nor did he contribute to the conversation. MOB reported that she and her 3 daughters currently live with her parents but that they are in the process of buying a home and they plan to move in to that home in the near future. MOB stated she currently works for [employer] and confirmed she receives food stamps and is aware of process to get infant added on   to her plan.   CSW inquired about MOB's mental health history and MOB denied any. MOB also denied any history of PPD with her other children but reported she has researched what it looks like so that she can be aware. CSW provided education regarding the baby blues period vs. perinatal mood disorders, discussed treatment and gave resources for mental health follow up if concerns arise.  CSW recommends self-evaluation during the postpartum time period using the New Mom Checklist from Postpartum Progress and encouraged MOB to contact a medical professional if symptoms are noted at any time. MOB denied any  current SI or HI and reported having good support from FOB, her family and her sister.   CSW inquired about reasoning for MOB entering prenatal care late and MOB explained she had difficulty getting into [facility] and reported that because of her suboxone she wanted someone aware of the medication. MOB stated someone recommended Dr. Stinson and that she has really enjoyed him as a doctor. MOB reported she is currently prescribed Suboxone by Dr. Corrington with [facility] and stated that she feels medication is effective in managing her withdrawals. CSW inquired about MOB's substance use during pregnancy and MOB confirmed she used THC to help with her symptoms. CSW noted FOB made a comment under his breath but was unable to decipher what was said. CSW explained Hospital Drug Policy due to MOB's late prenatal care and explained UDS and CDS were collected and that a CPS report would be made, if warranted. FOB then asked CSW what would happen if infant "tested positive for weed". CSW explained process of report and explained CPS would likely reach out within 72 hours if infant tested positive. CSW did not disclose positive UDS for THC with FOB present as CSW noted slight tension coming from FOB. CSW able to speak with MOB privately following infant's bath to disclose positive results. MOB denied any concerns regarding FOB and reported FOB was fine following conversation with CSW.   MOB confirmed having all essential items for infant once discharged and reported infant would be sleeping in a basinet once home. MOB aware of SIDS precautions and safe sleeping habits and was able to review them for CSW. MOB denied any further questions, concerns or need for resources from CSW at this time.  CSW to make [place] CPS report for infant's positive UDS for THC. No barriers to discharge, at this time, CPS to follow up with MOB within 72 hours.   CSW Plan/Description:  No Further Intervention  Required/No Barriers to Discharge, Sudden Infant Death Syndrome (SIDS) Education, Perinatal Mood and Anxiety Disorder (PMADs) Education, Hospital Drug Screen Policy Information, Child Protective Service Report , CSW Will Continue to Monitor Umbilical Cord Tissue Drug Screen Results and Make Report if Warranted    Chardonay [place]chfield, LCSWA 03/30/2019, 9:42 AM 

## 2019-03-30 NOTE — Progress Notes (Signed)
POSTPARTUM PROGRESS NOTE  Subjective: Kara [name] is a 33 y.o. [id number] s/p uncomplicated  SVD at [redacted]w[redacted]d  She reports she doing well. No acute events overnight. She denies any problems with ambulating, voiding or po intake. Denies nausea or vomiting. She has passed flatus. Pain is well controlled. Lochia has not subsided or slowed down yet. Still endorses oozing from incision site as well.  Objective: Blood pressure 113/67, pulse 65, temperature 98.2 F (36.8 C), temperature source Oral, resp. rate 18, height '5\' 4"'  (1.626 m), weight 104.4 kg, last menstrual period 07/15/2018, SpO2 96 %, unknown if currently breastfeeding.  Physical Exam:  General: alert, cooperative and no distress Chest: no respiratory distress Abdomen: soft, non-tender  Uterine Fundus: firm, appropriately tender Extremities: No calf swelling or tenderness  no edema  Recent Labs    03/29/19 0645  HGB 12.2  HCT 36.2    Assessment/Plan: Kara MARTONEis a 33y.o. [id number]/p uncomplicated SVD and BTL at 334w2dfter presenting for active labor.  Routine Postpartum Care: Doing well, pain well-controlled.  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  [facility]iss [facility]

## 2019-03-30 NOTE — Lactation Note (Signed)
This note was copied from a baby's chart. Lactation Consultation Note  Patient Name: [name] Date: 03/30/2019 Reason for consult: Difficult latch Mom called out for feeding assist.  Baby positioned in cross cradle hold on left.  Nipples are erect and large.  Baby made several attempts to latch but unable to grasp tissue.  Baby then positioned in football hold.  When baby was crying a tight lingual frenulum noted.  After several more attempts to latch baby did grasp tissue and sustained latch.  No swallows noted.  Discussed initiating pumping with symphony pump for stimulation.  Report given to RN.  She will set up DEBP and instruct mom.  Encouraged to call for assist prn.  Maternal Data    Feeding Feeding Type: Breast Fed  LATCH Score Latch: Repeated attempts needed to sustain latch, nipple held in mouth throughout feeding, stimulation needed to elicit sucking reflex.  Audible Swallowing: None  Type of Nipple: Everted at rest and after stimulation  Comfort (Breast/Nipple): Soft / non-tender  Hold (Positioning): Assistance needed to correctly position infant at breast and maintain latch.  LATCH Score: 6  Interventions    Lactation Tools Discussed/Used     Consult Status Consult Status: Follow-up Date: 03/31/19 Follow-up type: In-patient    Ave Filter 03/30/2019, 2:44 PM

## 2019-03-30 NOTE — Anesthesia Postprocedure Evaluation (Signed)
Anesthesia Post Note  Patient: Kara [name]  Procedure(s) Performed: POST PARTUM TUBAL LIGATION (N/A )     Patient location during evaluation: PACU Anesthesia Type: Epidural Level of consciousness: oriented and awake and alert Pain management: pain level controlled Vital Signs Assessment: post-procedure vital signs reviewed and stable Respiratory status: spontaneous breathing, respiratory function stable and nonlabored ventilation Cardiovascular status: blood pressure returned to baseline and stable Postop Assessment: no headache, no backache, no apparent nausea or vomiting and epidural receding Anesthetic complications: no    Last Vitals:  Vitals:   03/30/19 0410 03/30/19 0800  BP: 113/67 97/63  Pulse: 65 72  Resp: 18 20  Temp: 36.8 C 36.7 C  SpO2: 96% 99%    Last Pain:  Vitals:   03/30/19 0800  TempSrc: Oral  PainSc: 2                  Lidia Collum

## 2019-03-30 NOTE — Anesthesia Postprocedure Evaluation (Signed)
Anesthesia Post Note  Patient: Kara [name]  Procedure(s) Performed: AN AD HOC LABOR EPIDURAL     Patient location during evaluation: Mother Baby Anesthesia Type: Epidural Level of consciousness: awake and alert Pain management: pain level controlled Vital Signs Assessment: post-procedure vital signs reviewed and stable Respiratory status: spontaneous breathing, nonlabored ventilation and respiratory function stable Cardiovascular status: stable Postop Assessment: no headache, no backache and epidural receding Anesthetic complications: no Comments: Spoke with pt on the phone. No anesthetic complications noted.    Last Vitals:  Vitals:   03/30/19 0410 03/30/19 0800  BP: 113/67 97/63  Pulse: 65 72  Resp: 18 20  Temp: 36.8 C 36.7 C  SpO2: 96% 99%    Last Pain:  Vitals:   03/30/19 0800  TempSrc: Oral  PainSc: 2    Pain Goal:                Epidural/Spinal Function Cutaneous sensation: Normal sensation (03/30/19 0800), Patient able to flex knees: Yes (03/30/19 0800), Patient able to lift hips off bed: Yes (03/30/19 0800), Back pain beyond tenderness at insertion site: No (03/30/19 0800), Progressively worsening motor and/or sensory loss: No (03/30/19 0800), Bowel and/or bladder incontinence post epidural: No (03/30/19 0800)  Riki Sheer

## 2019-03-31 ENCOUNTER — Ambulatory Visit: Payer: Medicaid Other

## 2019-03-31 LAB — RH IG WORKUP (INCLUDES ABO/RH)
ABO/RH(D): B NEG
Fetal Screen: NEGATIVE
Gestational Age(Wks): 39.2
Unit division: 0

## 2019-03-31 LAB — TYPE AND SCREEN
ABO/RH(D): B NEG
Antibody Screen: POSITIVE
Unit division: 0
Unit division: 0

## 2019-03-31 LAB — BPAM RBC
Blood Product Expiration Date: 202008142359
Blood Product Expiration Date: 202008202359
Unit Type and Rh: 1700
Unit Type and Rh: 1700

## 2019-03-31 MED ORDER — MEASLES, MUMPS & RUBELLA VAC IJ SOLR
0.5000 mL | Freq: Once | INTRAMUSCULAR | Status: AC
  Administered 2019-03-31: 0.5 mL via SUBCUTANEOUS
  Filled 2019-03-31: qty 0.5

## 2019-03-31 MED ORDER — IBUPROFEN 600 MG PO TABS: 600.0000 mg | ORAL_TABLET | Freq: Four times a day (QID) | ORAL | 0 refills | Status: AC

## 2019-03-31 MED ORDER — PNEUMOCOCCAL VAC POLYVALENT 25 MCG/0.5ML IJ INJ
0.5000 mL | INJECTION | INTRAMUSCULAR | Status: AC
  Administered 2019-03-31: 0.5 mL via INTRAMUSCULAR

## 2019-03-31 NOTE — Lactation Note (Signed)
This note was copied from a baby's chart. Lactation Consultation Note  Patient Name: Kara [name] [id number] Date: 03/31/2019 Reason for consult: Follow-up assessment;Term Baby is 43 hours old/5% weight loss.  Mom is still struggling with latch.  She is pumping every 3 hours and obtained 20 mls this morning.  Discussed milk coming to volume and the prevention and treatment of engorgement.  Mom given a volumes for supplementation handout.  Instructed to give 15-30 mls if baby not latching.  Mom will give additional formula if needed.  She does not have an electric breast pump but plans on purchasing after discharge.  She does not have [place]. Manual pump given.  Mom will discuss tight frenulum with pediatrician at follow up for referral.  Reviewed outpatient services and encouraged to call prn.  Maternal Data    Feeding Feeding Type: Breast Milk Nipple Type: Slow - flow  LATCH Score                   Interventions    Lactation Tools Discussed/Used     Consult Status Consult Status: Complete Follow-up type: Call as needed    Ave Filter 03/31/2019, 10:44 AM

## 2019-03-31 NOTE — Progress Notes (Signed)
CSW aware infant will likely be discharged today 8/5. CSW made [place] CPS report yesterday regarding infant's positive UDS for THC. No barriers to discharge, at this time, CPS to follow up with MOB in the community.  Chalsea Darko, LCSWA  [facility] [phone]  

## 2019-04-01 ENCOUNTER — Encounter: Payer: Medicaid Other | Admitting: Obstetrics & Gynecology

## 2019-05-07 ENCOUNTER — Ambulatory Visit: Payer: Medicaid Other | Admitting: Family Medicine

## 2104-02-18 ENCOUNTER — Other Ambulatory Visit: Payer: Self-pay
